# Patient Record
Sex: Male | Born: 1960 | Race: White | Hispanic: No | Marital: Married | Smoking: Never smoker
Health system: Southern US, Community
[De-identification: ages and names within clinical notes are randomized; demographics above are authoritative.]

## PROBLEM LIST (undated history)

## (undated) DIAGNOSIS — E785 Hyperlipidemia, unspecified: Secondary | ICD-10-CM

## (undated) DIAGNOSIS — I10 Essential (primary) hypertension: Secondary | ICD-10-CM

## (undated) HISTORY — PX: HERNIA REPAIR: SHX51

## (undated) HISTORY — DX: Hyperlipidemia, unspecified: E78.5

## (undated) HISTORY — DX: Essential (primary) hypertension: I10

## (undated) HISTORY — PX: VASECTOMY: SHX75

---

## 2004-12-23 ENCOUNTER — Emergency Department: Payer: Self-pay | Admitting: Unknown Physician Specialty

## 2005-06-15 ENCOUNTER — Ambulatory Visit: Payer: Self-pay | Admitting: Family Medicine

## 2006-12-12 ENCOUNTER — Ambulatory Visit: Payer: Self-pay | Admitting: Family Medicine

## 2006-12-25 ENCOUNTER — Ambulatory Visit: Payer: Self-pay | Admitting: Surgery

## 2007-01-15 ENCOUNTER — Ambulatory Visit: Payer: Self-pay | Admitting: Unknown Physician Specialty

## 2007-02-05 ENCOUNTER — Ambulatory Visit: Payer: Self-pay | Admitting: Unknown Physician Specialty

## 2011-04-19 ENCOUNTER — Ambulatory Visit: Payer: Self-pay | Admitting: Unknown Physician Specialty

## 2011-06-16 ENCOUNTER — Emergency Department: Payer: Self-pay | Admitting: Emergency Medicine

## 2011-08-07 ENCOUNTER — Ambulatory Visit: Payer: Self-pay | Admitting: Internal Medicine

## 2013-01-24 ENCOUNTER — Ambulatory Visit: Payer: Self-pay | Admitting: Unknown Physician Specialty

## 2016-12-18 NOTE — Progress Notes (Signed)
12/19/2016 9:41 AM   Bryan Olson August 27, 1961 [id number]  Referring provider: Patrice Paradise, MD [address] [facility] [address] [facility] [place], [place] [address]  Chief Complaint  Patient presents with  . New Patient (Initial Visit)    Testicular Hypofunction referred by Merlinda Frederick    HPI: Patient is a 56 year old Caucasian with symptoms of hypogonadism who presents today for further evaluation by Markham Jordan, PA.    Hypogonadism Patient is experiencing a decrease in libido, a lack of energy, a decrease in strength, a loss in height, a decreased enjoyment in life,  sadness and/or grumpiness, erections being less strong, falling asleep after dinner and a recent deterioration in their work performance.   This is indicated by his responses to the ADAM questionnaire.  He is still having spontaneous erections at night.   He does not have sleep apnea.   He states he was seen by urology in the past for possible low testosterone and was told to lose weight.       Androgen Deficiency in the Aging Male    Row Name 12/19/16 0900         Androgen Deficiency in the Aging Male   Do you have a decrease in libido (sex drive) Yes     Do you have lack of energy Yes     Do you have a decrease in strength and/or endurance Yes     Have you lost height No     Have you noticed a decreased "enjoyment of life" Yes     Are you sad and/or grumpy Yes     Are your erections less strong Yes     Have you noticed a recent deterioration in your ability to play sports No     Are you falling asleep after dinner Yes     Has there been a recent deterioration in your work performance No       Erectile dysfunction His SHIM score is 14, which is mild to moderate ED.   He has been having difficulty with erections for ten years.   His major complaint is maintaining erections.  His libido is diminished.   His risk factors for ED are age, BPH, HTN, HLD and blood pressure medications.   He denies  any painful erections or curvatures with his erections.   He has tried PDE5i in the past and they worked but recently they have been ineffective.       SHIM    Row Name 12/19/16 0915         SHIM: Over the last 6 months:   How do you rate your confidence that you could get and keep an erection? Low     When you had erections with sexual stimulation, how often were your erections hard enough for penetration (entering your partner)? Sometimes (about half the time)     During sexual intercourse, how often were you able to maintain your erection after you had penetrated (entered) your partner? Difficult     During sexual intercourse, how difficult was it to maintain your erection to completion of intercourse? Difficult     When you attempted sexual intercourse, how often was it satisfactory for you? Difficult       SHIM Total Score   SHIM 14        Score: 1-7 Severe ED 8-11 Moderate ED 12-16 Mild-Moderate ED 17-21 Mild ED 22-25 No ED   BPH WITH LUTS His IPSS score today is 2,  which is mild lower urinary tract symptomatology.  He is mostly satisfied with his quality life due to his urinary symptoms.  His major complaint today is nocturia x 1.   He has had these symptoms for several years.  He denies any dysuria, hematuria or suprapubic pain.   He also denies any recent fevers, chills, nausea or vomiting.  He does not have a family history of PCa.     IPSS    Row Name 12/19/16 0900         International Prostate Symptom Score   How often have you had the sensation of not emptying your bladder? Not at All     How often have you had to urinate less than every two hours? Not at All     How often have you found you stopped and started again several times when you urinated? Not at All     How often have you found it difficult to postpone urination? Less than 1 in 5 times     How often have you had a weak urinary stream? Not at All     How often have you had to strain to start  urination? Not at All     How many times did you typically get up at night to urinate? 1 Time     Total IPSS Score 2       Quality of Life due to urinary symptoms   If you were to spend the rest of your life with your urinary condition just the way it is now how would you feel about that? Mostly Satisfied        Score:  1-7 Mild 8-19 Moderate 20-35 Severe    PMH: Past Medical History:  Diagnosis Date  . HLD (hyperlipidemia)   . HTN (hypertension)     Surgical History: Past Surgical History:  Procedure Laterality Date  . HERNIA REPAIR    . VASECTOMY      Home Medications:  Allergies as of 12/19/2016   No Known Allergies     Medication List       Accurate as of 12/19/16  9:41 AM. Always use your most recent med list.          aspirin EC 81 MG tablet Take by mouth.   atorvastatin 10 MG tablet Commonly known as:  LIPITOR Take by mouth.   bisoprolol-hydrochlorothiazide 10-6.25 MG tablet Commonly known as:  ZIAC Take by mouth.   losartan 100 MG tablet Commonly known as:  COZAAR Take by mouth.       Allergies: No Known Allergies  Family History: Family History  Problem Relation Age of Onset  . Prostate cancer Neg Hx   . Kidney cancer Neg Hx   . Bladder Cancer Neg Hx     Social History:  reports that he has never smoked. He has never used smokeless tobacco. He reports that he does not drink alcohol or use drugs.  ROS: UROLOGY Frequent Urination?: No Hard to postpone urination?: No Burning/pain with urination?: No Get up at night to urinate?: No Leakage of urine?: No Urine stream starts and stops?: No Trouble starting stream?: No Do you have to strain to urinate?: No Blood in urine?: No Urinary tract infection?: No Sexually transmitted disease?: No Injury to kidneys or bladder?: No Painful intercourse?: No Weak stream?: No Erection problems?: Yes Penile pain?: No  Gastrointestinal Nausea?: No Vomiting?: No Indigestion/heartburn?:  Yes Diarrhea?: No Constipation?: No  Constitutional Fever: No Night sweats?: No Weight  loss?: No Fatigue?: Yes  Skin Skin rash/lesions?: No Itching?: No  Eyes Blurred vision?: No Double vision?: No  Ears/Nose/Throat Sore throat?: No Sinus problems?: No  Hematologic/Lymphatic Swollen glands?: No Easy bruising?: No  Cardiovascular Leg swelling?: No Chest pain?: No  Respiratory Cough?: No Shortness of breath?: No  Endocrine Excessive thirst?: No  Musculoskeletal Back pain?: No Joint pain?: No  Neurological Headaches?: No Dizziness?: No  Psychologic Depression?: No Anxiety?: No  Physical Exam: BP 133/81   Pulse 71   Ht 5\' 7"  (1.702 m)   Wt 288 lb 3.2 oz (130.7 kg)   BMI 45.14 kg/m   Constitutional: Well nourished. Alert and oriented, No acute distress. HEENT: [place] AT, moist mucus membranes. Trachea midline, no masses. Cardiovascular: No clubbing, cyanosis, or edema. Respiratory: Normal respiratory effort, no increased work of breathing. GI: Abdomen is soft, non tender, non distended, no abdominal masses. Liver and spleen not palpable.  No hernias appreciated.  Stool sample for occult testing is not indicated.   GU: No CVA tenderness.  No bladder fullness or masses.  Patient with circumcised phallus. Buried penis.  Urethral meatus is patent.  No penile discharge. No penile lesions or rashes. Scrotum without lesions, cysts, rashes and/or edema.  Testicles are located scrotally bilaterally. No masses are appreciated in the testicles. Left and right epididymis are normal. Rectal: Patient with  normal sphincter tone. Anus and perineum without scarring or rashes. No rectal masses are appreciated. Prostate is approximately 45 grams, no nodules are appreciated. Seminal vesicles are normal. Skin: No rashes, bruises or suspicious lesions. Lymph: No cervical or inguinal adenopathy. Neurologic: Grossly intact, no focal deficits, moving all 4 extremities. Psychiatric:  Normal mood and affect.  Laboratory Data: PSA History  0.7 ng/mL on 11/29/2016   Assessment & Plan:    1. Hypogonadism  - I explained to patient that the current recommendations from the Endocrine Society reports the diagnosis of hypogonadism requires a serum total testosterone level obtained between 8 and 10 AM at least 2 days apart that is below the laboratory parameters  for normal testosterone.     - At this time, the patient does not meet this requirement.  He will return for two morning serum testosterones, two days apart before 10 AM   -I discussed with the patient the side effects of testosterone therapy, such as: enlargement of the prostate gland that may in turn cause LUTS, possible increased risk of PCa, DVT's and/or PE's, possible increased risk of heart attack or stroke, lower sperm count, swelling of the ankles, feet, or body, with or without heart failure, enlarged or painful breasts, have problems breathing while you sleep (sleep apnea), increased prostate specific antigen, mood swings, hypertension and increased red blood cell count.  - I also discussed that some men have had success using clomid for hypogonadism.  It does seem to be more successful in younger men, but there are incidences of good results in middle-aged men.  I explained that it is used in male infertility to stimulate the testicles to make more testosterone/sperm.  There has been no long term data on side effects, but some urologists has been having success with this medication.   2. Erectile dysfunction  - SHIM score is 14  - I explained to the patient that in order to achieve an erection it takes good functioning of the nervous system (parasympathetic, sympathetic, sensory and motor), good blood flow into the erectile tissue of the penis and a desire to have sex  -  I explained that conditions like diabetes, hypertension, coronary artery disease, peripheral vascular disease, smoking, alcohol consumption, age,  sleep apnea and BPH can diminish the ability to have an erection  - RTC in 6 months for repeat SHIM score and exam   BPH with LUTS  - IPSS score is 6/2  - Continue conservative management, avoiding bladder irritants and timed voiding's  - RTC in 6 months for IPSS, PSA and exam    Return for RTC on Friday morning before 10 AM for testosterone only.  These notes generated with voice recognition software. I apologize for typographical errors.  Michiel Cowboy, PA-C  [facility] [address] [place], [place] [address] [phone]

## 2016-12-19 ENCOUNTER — Ambulatory Visit: Payer: Managed Care, Other (non HMO) | Admitting: Urology

## 2016-12-19 ENCOUNTER — Encounter: Payer: Self-pay | Admitting: Urology

## 2016-12-19 VITALS — BP 133/81 | HR 71 | Ht 67.0 in | Wt 288.2 lb

## 2016-12-19 DIAGNOSIS — N529 Male erectile dysfunction, unspecified: Secondary | ICD-10-CM

## 2016-12-19 DIAGNOSIS — E291 Testicular hypofunction: Secondary | ICD-10-CM | POA: Diagnosis not present

## 2016-12-19 DIAGNOSIS — N401 Enlarged prostate with lower urinary tract symptoms: Secondary | ICD-10-CM | POA: Diagnosis not present

## 2016-12-19 DIAGNOSIS — N138 Other obstructive and reflux uropathy: Secondary | ICD-10-CM

## 2016-12-20 ENCOUNTER — Telehealth: Payer: Self-pay

## 2016-12-20 DIAGNOSIS — E291 Testicular hypofunction: Secondary | ICD-10-CM

## 2016-12-20 LAB — TESTOSTERONE: Testosterone: 188 ng/dL — ABNORMAL LOW (ref 264–916)

## 2016-12-20 NOTE — Telephone Encounter (Signed)
-----   Message from Harle BattiestShannon A McGowan, PA-C sent at 12/20/2016  8:03 AM EST ----- Please notify the patient that his first morning testosterone is low.  We will need the second testosterone to confirm.

## 2016-12-20 NOTE — Telephone Encounter (Signed)
Spoke with pt in reference to lab results. Pt stated he has another lab appt on Friday.

## 2016-12-22 ENCOUNTER — Other Ambulatory Visit: Payer: Managed Care, Other (non HMO)

## 2016-12-22 DIAGNOSIS — E291 Testicular hypofunction: Secondary | ICD-10-CM

## 2016-12-23 LAB — TESTOSTERONE: Testosterone: 195 ng/dL — ABNORMAL LOW (ref 264–916)

## 2016-12-25 ENCOUNTER — Telehealth: Payer: Self-pay

## 2016-12-25 NOTE — Telephone Encounter (Signed)
Labs were added.

## 2016-12-25 NOTE — Telephone Encounter (Signed)
-----   Message from Harle BattiestShannon A McGowan, PA-C sent at 12/24/2016 10:28 AM EST ----- Please notify the patient that his second testosterone has returned below the laboratory parameters for testosterone. He does meet the criteria for hypogonadism at this time. Please add an LH, FSH, prolactin and LFTs to his blood work.

## 2016-12-26 ENCOUNTER — Other Ambulatory Visit: Payer: Self-pay | Admitting: Urology

## 2016-12-26 LAB — FSH/LH
FSH: 4.5 m[IU]/mL (ref 1.5–12.4)
LH: 2.7 m[IU]/mL (ref 1.7–8.6)

## 2016-12-26 LAB — HEPATIC FUNCTION PANEL
ALBUMIN: 4.1 g/dL (ref 3.5–5.5)
ALK PHOS: 100 IU/L (ref 39–117)
ALT: 61 IU/L — ABNORMAL HIGH (ref 0–44)
AST: 33 IU/L (ref 0–40)
BILIRUBIN, DIRECT: 0.08 mg/dL (ref 0.00–0.40)
Bilirubin Total: 0.3 mg/dL (ref 0.0–1.2)
TOTAL PROTEIN: 6.4 g/dL (ref 6.0–8.5)

## 2016-12-26 LAB — PROLACTIN: PROLACTIN: 19.8 ng/mL — AB (ref 4.0–15.2)

## 2016-12-26 LAB — SPECIMEN STATUS REPORT

## 2016-12-27 ENCOUNTER — Telehealth: Payer: Self-pay

## 2016-12-27 DIAGNOSIS — E229 Hyperfunction of pituitary gland, unspecified: Principal | ICD-10-CM

## 2016-12-27 DIAGNOSIS — R7989 Other specified abnormal findings of blood chemistry: Secondary | ICD-10-CM

## 2016-12-27 NOTE — Telephone Encounter (Signed)
Spoke with pt in reference to elevated prolactin and needing and MRI of pituitary gland. Pt voiced understanding. Orders were placed.

## 2016-12-27 NOTE — Telephone Encounter (Signed)
-----   Message from Harle BattiestShannon A McGowan, PA-C sent at 12/26/2016  7:48 AM EST ----- Please notify the patient that his prolactin level is elevated.  We will need to schedule an MRI of his pituitary gland to make sure there are no abnormalities within the gland.

## 2017-01-11 ENCOUNTER — Ambulatory Visit
Admission: RE | Admit: 2017-01-11 | Discharge: 2017-01-11 | Disposition: A | Discharge status: Home / Self Care | Payer: Managed Care, Other (non HMO) | Source: Ambulatory Visit | Attending: Urology | Admitting: Urology

## 2017-01-11 DIAGNOSIS — E229 Hyperfunction of pituitary gland, unspecified: Secondary | ICD-10-CM | POA: Diagnosis not present

## 2017-01-11 DIAGNOSIS — R7989 Other specified abnormal findings of blood chemistry: Secondary | ICD-10-CM

## 2017-01-11 DIAGNOSIS — R9082 White matter disease, unspecified: Secondary | ICD-10-CM | POA: Diagnosis not present

## 2017-01-11 MED ORDER — GADOBENATE DIMEGLUMINE 529 MG/ML IV SOLN
10.0000 mL | Freq: Once | INTRAVENOUS | Status: AC | PRN
Start: 2017-01-11 — End: 2017-01-11
  Administered 2017-01-11: 10 mL via INTRAVENOUS

## 2017-01-12 ENCOUNTER — Telehealth: Payer: Self-pay

## 2017-01-12 NOTE — Telephone Encounter (Signed)
LMOM

## 2017-01-12 NOTE — Telephone Encounter (Signed)
Spoke with pt in reference to MRI results and needing to see PCP. Pt voiced understanding.

## 2017-01-12 NOTE — Telephone Encounter (Signed)
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  This can be seen with uncontrolled high blood pressure, strokes, diabetes, etc.  He needs to see his PCP regarding this finding.

## 2020-06-24 ENCOUNTER — Inpatient Hospital Stay
Admission: EM | Admit: 2020-06-24 | Discharge: 2020-06-29 | DRG: 872 | Disposition: A | Discharge status: Home / Self Care | Payer: BC Managed Care – PPO | Attending: Internal Medicine | Admitting: Internal Medicine

## 2020-06-24 ENCOUNTER — Encounter: Payer: Self-pay | Admitting: Emergency Medicine

## 2020-06-24 ENCOUNTER — Emergency Department: Payer: BC Managed Care – PPO

## 2020-06-24 ENCOUNTER — Other Ambulatory Visit: Payer: Self-pay

## 2020-06-24 DIAGNOSIS — E785 Hyperlipidemia, unspecified: Secondary | ICD-10-CM | POA: Diagnosis present

## 2020-06-24 DIAGNOSIS — Z20822 Contact with and (suspected) exposure to covid-19: Secondary | ICD-10-CM | POA: Diagnosis present

## 2020-06-24 DIAGNOSIS — Z6841 Body Mass Index (BMI) 40.0 and over, adult: Secondary | ICD-10-CM

## 2020-06-24 DIAGNOSIS — E876 Hypokalemia: Secondary | ICD-10-CM

## 2020-06-24 DIAGNOSIS — R739 Hyperglycemia, unspecified: Secondary | ICD-10-CM | POA: Diagnosis present

## 2020-06-24 DIAGNOSIS — L03116 Cellulitis of left lower limb: Secondary | ICD-10-CM | POA: Diagnosis present

## 2020-06-24 DIAGNOSIS — D696 Thrombocytopenia, unspecified: Secondary | ICD-10-CM | POA: Diagnosis present

## 2020-06-24 DIAGNOSIS — L0390 Cellulitis, unspecified: Secondary | ICD-10-CM | POA: Diagnosis not present

## 2020-06-24 DIAGNOSIS — E872 Acidosis, unspecified: Secondary | ICD-10-CM

## 2020-06-24 DIAGNOSIS — R05 Cough: Secondary | ICD-10-CM | POA: Diagnosis not present

## 2020-06-24 DIAGNOSIS — A419 Sepsis, unspecified organism: Principal | ICD-10-CM

## 2020-06-24 DIAGNOSIS — N179 Acute kidney failure, unspecified: Secondary | ICD-10-CM | POA: Diagnosis present

## 2020-06-24 DIAGNOSIS — M7989 Other specified soft tissue disorders: Secondary | ICD-10-CM | POA: Diagnosis present

## 2020-06-24 DIAGNOSIS — Z79899 Other long term (current) drug therapy: Secondary | ICD-10-CM | POA: Diagnosis not present

## 2020-06-24 DIAGNOSIS — I10 Essential (primary) hypertension: Secondary | ICD-10-CM | POA: Diagnosis present

## 2020-06-24 DIAGNOSIS — R0682 Tachypnea, not elsewhere classified: Secondary | ICD-10-CM | POA: Diagnosis present

## 2020-06-24 DIAGNOSIS — R000 Tachycardia, unspecified: Secondary | ICD-10-CM | POA: Diagnosis present

## 2020-06-24 DIAGNOSIS — Z7982 Long term (current) use of aspirin: Secondary | ICD-10-CM | POA: Diagnosis not present

## 2020-06-24 DIAGNOSIS — R6520 Severe sepsis without septic shock: Secondary | ICD-10-CM | POA: Diagnosis present

## 2020-06-24 LAB — BASIC METABOLIC PANEL
Anion gap: 15 (ref 5–15)
BUN: 24 mg/dL — ABNORMAL HIGH (ref 6–20)
CO2: 25 mmol/L (ref 22–32)
Calcium: 8.7 mg/dL — ABNORMAL LOW (ref 8.9–10.3)
Chloride: 100 mmol/L (ref 98–111)
Creatinine, Ser: 1.85 mg/dL — ABNORMAL HIGH (ref 0.61–1.24)
GFR calc Af Amer: 46 mL/min — ABNORMAL LOW (ref >60)
GFR calc non Af Amer: 39 mL/min — ABNORMAL LOW (ref >60)
Glucose, Bld: 111 mg/dL — ABNORMAL HIGH (ref 70–99)
Potassium: 3.6 mmol/L (ref 3.5–5.1)
Sodium: 140 mmol/L (ref 135–145)

## 2020-06-24 LAB — CBC WITH DIFFERENTIAL/PLATELET
Abs Immature Granulocytes: 0.09 10*3/uL — ABNORMAL HIGH (ref 0.00–0.07)
Basophils Absolute: 0.1 10*3/uL (ref 0.0–0.1)
Basophils Relative: 0 %
Eosinophils Absolute: 0 10*3/uL (ref 0.0–0.5)
Eosinophils Relative: 0 %
HCT: 45.2 % (ref 39.0–52.0)
Hemoglobin: 15.8 g/dL (ref 13.0–17.0)
Immature Granulocytes: 1 %
Lymphocytes Relative: 5 %
Lymphs Abs: 0.8 10*3/uL (ref 0.7–4.0)
MCH: 30 pg (ref 26.0–34.0)
MCHC: 35 g/dL (ref 30.0–36.0)
MCV: 85.9 fL (ref 80.0–100.0)
Monocytes Absolute: 1 10*3/uL (ref 0.1–1.0)
Monocytes Relative: 6 %
Neutro Abs: 15 10*3/uL — ABNORMAL HIGH (ref 1.7–7.7)
Neutrophils Relative %: 88 %
Platelets: 166 10*3/uL (ref 150–400)
RBC: 5.26 MIL/uL (ref 4.22–5.81)
RDW: 14.1 % (ref 11.5–15.5)
WBC: 17 10*3/uL — ABNORMAL HIGH (ref 4.0–10.5)
nRBC: 0 % (ref 0.0–0.2)

## 2020-06-24 LAB — URINALYSIS, COMPLETE (UACMP) WITH MICROSCOPIC
Bacteria, UA: NONE SEEN
Bilirubin Urine: NEGATIVE
Glucose, UA: NEGATIVE mg/dL
Hgb urine dipstick: NEGATIVE
Ketones, ur: 5 mg/dL — AB
Leukocytes,Ua: NEGATIVE
Nitrite: NEGATIVE
Protein, ur: NEGATIVE mg/dL
Specific Gravity, Urine: 1.011 (ref 1.005–1.030)
pH: 5 (ref 5.0–8.0)

## 2020-06-24 LAB — LACTIC ACID, PLASMA
Lactic Acid, Venous: 1.3 mmol/L (ref 0.5–1.9)
Lactic Acid, Venous: 2 mmol/L (ref 0.5–1.9)
Lactic Acid, Venous: 1.3 mmol/L (ref 0.5–1.9)

## 2020-06-24 LAB — COMPREHENSIVE METABOLIC PANEL
ALT: 57 U/L — ABNORMAL HIGH (ref 0–44)
AST: 34 U/L (ref 15–41)
Albumin: 3.9 g/dL (ref 3.5–5.0)
Alkaline Phosphatase: 63 U/L (ref 38–126)
Anion gap: 13 (ref 5–15)
BUN: 19 mg/dL (ref 6–20)
CO2: 25 mmol/L (ref 22–32)
Calcium: 8.7 mg/dL — ABNORMAL LOW (ref 8.9–10.3)
Chloride: 101 mmol/L (ref 98–111)
Creatinine, Ser: 1.52 mg/dL — ABNORMAL HIGH (ref 0.61–1.24)
GFR calc Af Amer: 58 mL/min — ABNORMAL LOW (ref >60)
GFR calc non Af Amer: 50 mL/min — ABNORMAL LOW (ref >60)
Glucose, Bld: 152 mg/dL — ABNORMAL HIGH (ref 70–99)
Potassium: 3.2 mmol/L — ABNORMAL LOW (ref 3.5–5.1)
Sodium: 139 mmol/L (ref 135–145)
Total Bilirubin: 2.6 mg/dL — ABNORMAL HIGH (ref 0.3–1.2)
Total Protein: 7.2 g/dL (ref 6.5–8.1)

## 2020-06-24 LAB — GLUCOSE, CAPILLARY: Glucose-Capillary: 92 mg/dL (ref 70–99)

## 2020-06-24 LAB — MAGNESIUM: Magnesium: 2.1 mg/dL (ref 1.7–2.4)

## 2020-06-24 LAB — SARS CORONAVIRUS 2 BY RT PCR (HOSPITAL ORDER, PERFORMED IN ~~LOC~~ HOSPITAL LAB): SARS Coronavirus 2: NEGATIVE

## 2020-06-24 MED ORDER — SODIUM CHLORIDE 0.9 % IV SOLN
1.0000 g | INTRAVENOUS | Status: DC
  Administered 2020-06-24 – 2020-06-28 (×5): 1 g via INTRAVENOUS
  Filled 2020-06-24: qty 10
  Filled 2020-06-24: qty 1
  Filled 2020-06-24: qty 10
  Filled 2020-06-24 (×3): qty 1

## 2020-06-24 MED ORDER — LACTATED RINGERS IV SOLN: INTRAVENOUS | Status: DC

## 2020-06-24 MED ORDER — ACETAMINOPHEN 325 MG PO TABS
650.0000 mg | ORAL_TABLET | Freq: Four times a day (QID) | ORAL | Status: DC | PRN
  Administered 2020-06-24 – 2020-06-28 (×3): 650 mg via ORAL
  Filled 2020-06-24 (×9): qty 2

## 2020-06-24 MED ORDER — VANCOMYCIN HCL IN DEXTROSE 1-5 GM/200ML-% IV SOLN
1000.0000 mg | Freq: Once | INTRAVENOUS | Status: AC
  Administered 2020-06-24: 1000 mg via INTRAVENOUS
  Filled 2020-06-24: qty 200

## 2020-06-24 MED ORDER — CEFTRIAXONE SODIUM 1 G IJ SOLR
1.0000 g | Freq: Once | INTRAMUSCULAR | Status: AC
  Administered 2020-06-24: 1 g via INTRAMUSCULAR
  Filled 2020-06-24: qty 10

## 2020-06-24 MED ORDER — VANCOMYCIN HCL 1500 MG/300ML IV SOLN
1500.0000 mg | Freq: Once | INTRAVENOUS | Status: AC
  Administered 2020-06-24: 1500 mg via INTRAVENOUS
  Filled 2020-06-24: qty 300

## 2020-06-24 MED ORDER — SODIUM CHLORIDE 0.9 % IV BOLUS (SEPSIS)
1000.0000 mL | Freq: Once | INTRAVENOUS | Status: AC
  Administered 2020-06-24: 1000 mL via INTRAVENOUS

## 2020-06-24 MED ORDER — SODIUM CHLORIDE 0.9 % IV BOLUS
1000.0000 mL | Freq: Once | INTRAVENOUS | Status: AC
  Administered 2020-06-24: 1000 mL via INTRAVENOUS

## 2020-06-24 MED ORDER — LIDOCAINE HCL (PF) 1 % IJ SOLN
5.0000 mL | Freq: Once | INTRAMUSCULAR | Status: AC
  Administered 2020-06-24: 5 mL via INTRADERMAL
  Filled 2020-06-24: qty 5

## 2020-06-24 MED ORDER — VANCOMYCIN HCL 1500 MG/300ML IV SOLN
1500.0000 mg | INTRAVENOUS | Status: DC
  Filled 2020-06-24: qty 300

## 2020-06-24 NOTE — Progress Notes (Signed)
CODE SEPSIS - PHARMACY COMMUNICATION  **Broad Spectrum Antibiotics should be administered within 1 hour of Sepsis diagnosis**  Time Code Sepsis Called/Page Received: 1943  Antibiotics Ordered: Vancomycin/ rocephin   Time of 1st antibiotic administration: 1930   Katha Cabal ,PharmD Clinical Pharmacist  06/24/2020  7:50 PM

## 2020-06-24 NOTE — Progress Notes (Signed)
Pharmacy Antibiotic Note  Bryan Olson is a 59 y.o. male admitted on 06/24/2020 with cellulitis.  Pharmacy has been consulted for Vancomycin dosing.  Plan: Vancomycin 2500mg  IV loading dose followed by 1500mg  IV q24h. Will follow renal function.  Height: 5\' 7"  (170.2 cm) Weight: 130.7 kg (288 lb 2.3 oz) IBW/kg (Calculated) : 66.1  Temp (24hrs), Avg:98.8 F (37.1 C), Min:98.3 F (36.8 C), Max:99.1 F (37.3 C)  Recent Labs  Lab 06/24/20 0905 06/24/20 1855  WBC 17.0*  --   CREATININE 1.52* 1.85*  LATICACIDVEN 1.3 2.0*    Estimated Creatinine Clearance: 56.6 mL/min (A) (by C-G formula based on SCr of 1.85 mg/dL (H)).    No Known Allergies  Antimicrobials this admission: Vancomycin 8/19 >>   Dose adjustments this admission:  Microbiology results:   Thank you for allowing pharmacy to be a part of this patient's care.  06/26/20, PharmD, BCPS 06/24/2020 9:05 PM

## 2020-06-24 NOTE — ED Triage Notes (Signed)
Scratched left lower leg on a crab pot one week ago.  STates since Tuesday, lower leg reddened.  Also c/o fevers intermittently.

## 2020-06-24 NOTE — ED Notes (Signed)
See triage note  Presents with redness and swelling to left lower leg  States he scratched his leg about 1 week ago

## 2020-06-24 NOTE — ED Provider Notes (Signed)
[facility] Emergency Department Provider Note  ____________________________________________   First MD Initiated Contact with Patient 06/24/20 1653     (approximate)  I have reviewed the triage vital signs and the nursing notes.   HISTORY  Chief Complaint Leg Swelling    HPI Bryan Olson is a 59 y.o. male presents emergency department with redness and swelling to the left lower leg.  Patient states he started having chills and sweats last night.  Some nausea but no vomiting.  States he hit his leg on a old crab pot last week and then jumped in the pool thinking it would clean the wound out.  Now his left lower leg has gotten very red and tender with some pain radiating up into the left thigh.  No chest pain or shortness of breath.  No urinary type symptoms.  Tetanus is up-to-date.  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  Has a history of elevated liver enzymes    Past Medical History:  Diagnosis Date  . HLD (hyperlipidemia)   . HTN (hypertension)     Patient Active Problem List   Diagnosis Date Noted  . Cellulitis 06/24/2020  . HLD (hyperlipidemia)   . HTN (hypertension)   . Sepsis (HCC)   . AKI (acute kidney injury) (HCC)   . Lactic acidosis     Past Surgical History:  Procedure Laterality Date  . HERNIA REPAIR    . VASECTOMY      Prior to Admission medications   Medication Sig Start Date End Date Taking? Authorizing Provider  aspirin EC 81 MG tablet Take by mouth.    [provider]  atorvastatin (LIPITOR) 10 MG tablet Take by mouth. 11/29/16   [provider]  bisoprolol-hydrochlorothiazide [facility]) 10-6.25 MG tablet Take by mouth. 11/29/16   [provider]  losartan (COZAAR) 100 MG tablet Take by mouth. 11/29/16   [provider]    Allergies Patient has no known allergies.  Family History  Problem Relation Age of Onset  . Prostate cancer Neg Hx   . Kidney cancer Neg Hx    . Bladder Cancer Neg Hx     Social History Social History   Tobacco Use  . Smoking status: Never Smoker  . Smokeless tobacco: Never Used  Substance Use Topics  . Alcohol use: No  . Drug use: No    Review of Systems  Constitutional: Positive fever/chills Eyes: No visual changes. ENT: No sore throat. Respiratory: Denies cough Cardiovascular: Denies chest pain Gastrointestinal: Denies abdominal pain Genitourinary: Negative for dysuria. Musculoskeletal: Negative for back pain. Skin: Negative for rash.  Positive for redness to the lower leg Psychiatric: no mood changes,     ____________________________________________   PHYSICAL EXAM:  VITAL SIGNS: ED Triage Vitals [06/24/20 0902]  Enc Vitals Group     BP 120/73     Pulse Rate 74     Resp 16     Temp 99.1 F (37.3 C)     Temp Source Oral     SpO2 95 %     Weight 288 lb 2.3 oz (130.7 kg)     Height 5\' 7"  (1.702 m)     Head Circumference      Peak Flow      Pain Score 6     Pain Loc      Pain Edu?      Excl. in GC?     Constitutional: Alert and oriented. Well  appearing and in no acute distress. Eyes: Conjunctivae are normal.  Head: Atraumatic. Nose: No congestion/rhinnorhea. Mouth/Throat: Mucous membranes are moist.   Neck:  supple no lymphadenopathy noted Cardiovascular: Normal rate, regular rhythm. Heart sounds are normal Respiratory: Normal respiratory effort.  No retractions, lungs c t a  Abd: soft nontender bs normal all 4 quad GU: deferred Musculoskeletal: FROM all extremities, warm and well perfused, left lower leg has circumferential redness noted and old abrasion noted on the leg, streaks heading up to the thigh, neurovascular is intact Neurologic:  Normal speech and language.  Skin:  Skin is warm, dry and intact.  Redness like cellulitis noted to the left lower leg Psychiatric: Mood and affect are normal. Speech and behavior are normal.  ____________________________________________    LABS (all labs ordered are listed, but only abnormal results are displayed)  Labs Reviewed  COMPREHENSIVE METABOLIC PANEL - Abnormal; Notable for the following components:      Result Value   Potassium 3.2 (*)    Glucose, Bld 152 (*)    Creatinine, Ser 1.52 (*)    Calcium 8.7 (*)    ALT 57 (*)    Total Bilirubin 2.6 (*)    GFR calc non Af Amer 50 (*)    GFR calc Af Amer 58 (*)    All other components within normal limits  CBC WITH DIFFERENTIAL/PLATELET - Abnormal; Notable for the following components:   WBC 17.0 (*)    Neutro Abs 15.0 (*)    Abs Immature Granulocytes 0.09 (*)    All other components within normal limits  URINALYSIS, COMPLETE (UACMP) WITH MICROSCOPIC - Abnormal; Notable for the following components:   Color, Urine YELLOW (*)    APPearance HAZY (*)    Ketones, ur 5 (*)    All other components within normal limits  BASIC METABOLIC PANEL - Abnormal; Notable for the following components:   Glucose, Bld 111 (*)    BUN 24 (*)    Creatinine, Ser 1.85 (*)    Calcium 8.7 (*)    GFR calc non Af Amer 39 (*)    GFR calc Af Amer 46 (*)    All other components within normal limits  LACTIC ACID, PLASMA - Abnormal; Notable for the following components:   Lactic Acid, Venous 2.0 (*)    All other components within normal limits  SARS CORONAVIRUS 2 BY RT PCR (HOSPITAL ORDER, PERFORMED IN [facility])  CULTURE, BLOOD (SINGLE)  LACTIC ACID, PLASMA  GLUCOSE, CAPILLARY  MAGNESIUM  LACTIC ACID, PLASMA  CBG MONITORING, ED   ____________________________________________   ____________________________________________  RADIOLOGY  Ultrasound of the left lower extremity does not show a DVT  ____________________________________________   PROCEDURES  Procedure(s) performed: Rocephin 1 g IM   Procedures    ____________________________________________   INITIAL IMPRESSION / ASSESSMENT AND PLAN / ED COURSE  Pertinent labs & imaging results that were  available during my care of the patient were reviewed by me and considered in my medical decision making (see chart for details).   The patient is a 59 year old male presents emergency department with chills and redness to the left lower leg.  See HPI.  Physical exam is consistent with cellulitis.  However the patient is tender in the left great saphenous vein area so we will do a ultrasound to rule out DVT.  DDx: Cellulitis, DVT, osteomyelitis, sepsis  CBC is elevated at 17, lactic acid is normal, comprehensive metabolic panel has decreased potassium of 3.2, elevated glucose at  152  I feel that the patient is not septic as his lactic is normal and his heart rate is normal. Due to the cellulitis did give him Rocephin 1 g IM.  He is able to tolerate p.o. fluids.  I anticipate discharging him with Keflex and Septra.  ----------------------------------------- 7:42 PM on 06/24/2020 -----------------------------------------  Patient appears to have decompensated while here in the ED.  Kidney functions have decreased and his lactic acid is now come back with an increased level of 2.0.  I do feel at this time the patient will need to be admitted.  We have already given him Rocephin 1 g IM so we will order another gram of Rocephin IV, vancomycin is currently running.  Fluids have currently been given.  Patient and wife were notified of the evolving results and his decompensation.  He agrees for admission.    Bryan Olson was evaluated in Emergency Department on 06/24/2020 for the symptoms described in the history of present illness. He was evaluated in the context of the global COVID-19 pandemic, which necessitated consideration that the patient might be at risk for infection with the SARS-CoV-2 virus that causes COVID-19. Institutional protocols and algorithms that pertain to the evaluation of patients at risk for COVID-19 are in a state of rapid change based on information released by regulatory  bodies including the CDC and federal and state organizations. These policies and algorithms were followed during the patient's care in the ED.    As part of my medical decision making, I reviewed the following data within the electronic MEDICAL RECORD NUMBER History obtained from family, Nursing notes reviewed and incorporated, Labs reviewed , Old chart reviewed, Radiograph reviewed , Discussed with admitting physician , Notes from prior ED visits and [place] Controlled Substance Database  ____________________________________________   FINAL CLINICAL IMPRESSION(S) / ED DIAGNOSES  Final diagnoses:  Cellulitis of left lower extremity  Sepsis due to cellulitis (HCC)      NEW MEDICATIONS STARTED DURING THIS VISIT:  New Prescriptions   No medications on file     Note:  This document was prepared using Dragon voice recognition software and may include unintentional dictation errors.    Faythe Ghee, PA-C 06/24/20 2106    Dionne Bucy, MD 06/24/20 2244

## 2020-06-24 NOTE — H&P (Signed)
History and Physical        Hospital Admission Note Date: 06/24/2020  Patient name: Bryan Olson Medical record number: [id number] Date of birth: [birth year] Age: 59 y.o. Gender: male  PCP: Patrice Paradise, MD    Patient coming from: Home   I have reviewed all records in the [facility].    Chief Complaint:  Leg Swelling/Redness   HPI: Bryan Olson is a 59 y.o. male with PMH of HTN and HLD whp presents for redness and edema of LLE. Woke up this morning and noticed the redness. Started having chills and sweats last night. Vomited yesterday. No emesis today but positive for nausea. He bumped his leg on a "old, nasty, dirty" crab pot last Thursday then was in the swimming pool at [facility]. Pain is very tender and extends from ankle to inner thigh. 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 Has been in ED since before 8 am this morning. Reports has had nothing to eat or drink all day.    ED work-up/course:  The patient is a 59 year old male presents emergency department with chills and redness to the left lower leg.  See HPI.  Physical exam is consistent with cellulitis.  However the patient is tender in the left great saphenous vein area so we will do a ultrasound to rule out DVT.  DDx: Cellulitis, DVT, osteomyelitis, sepsis  CBC is elevated at 17, lactic acid is normal, comprehensive metabolic panel has decreased potassium of 3.2, elevated glucose at 152  I feel that the patient is not septic as his lactic is normal and his heart rate is normal. Due to the cellulitis did give him Rocephin 1 g IM.  He is able to tolerate p.o. fluids.  I anticipate discharging him with Keflex and Septra.  ----------------------------------------- 7:42 PM on 06/24/2020 -----------------------------------------  Patient appears to have decompensated while here in the ED.  Kidney  functions have decreased and his lactic acid is now come back with an increased level of 2.0.  I do feel at this time the patient will need to be admitted.  We have already given him Rocephin 1 g IM so we will order another gram of Rocephin IV, vancomycin is currently running.  Fluids have currently been given.  Patient and wife were notified of the evolving results and his decompensation.  He agrees for admission.   Review of Systems: Positives marked in 'bold' Constitutional: Denies fever, chills, diaphoresis, poor appetite and fatigue.  HEENT: Denies photophobia, eye pain, redness, hearing loss, ear pain, congestion, sore throat, rhinorrhea, sneezing, mouth sores, trouble swallowing, neck pain, neck stiffness and tinnitus.   Respiratory: Denies SOB, DOE, cough, chest tightness,  and wheezing.   Cardiovascular: Denies chest pain, palpitations and leg swelling.  Gastrointestinal: Denies nausea, vomiting, abdominal pain, diarrhea, constipation, blood in stool and abdominal distention.  Genitourinary: Denies dysuria, urgency, frequency, hematuria, flank pain and difficulty urinating.  Musculoskeletal: Denies myalgias, back pain, joint swelling, arthralgias and gait problem.  Skin: Denies pallor, rash and wound.  Neurological: Denies dizziness, seizures, syncope, weakness, light-headedness, numbness and headaches.  Hematological: Denies adenopathy. Easy bruising,  personal or family bleeding history  Psychiatric/Behavioral: Denies suicidal ideation, mood changes, confusion, nervousness, sleep disturbance and agitation  Past Medical History: Past Medical History:  Diagnosis Date  . HLD (hyperlipidemia)   . HTN (hypertension)     Past Surgical History:  Procedure Laterality Date  . HERNIA REPAIR    . VASECTOMY      Medications: Prior to Admission medications   Medication Sig Start Date End Date Taking? Authorizing Provider  aspirin EC 81 MG tablet Take by mouth.    [provider]  atorvastatin (LIPITOR) 10 MG tablet Take by mouth. 11/29/16   [provider]  bisoprolol-hydrochlorothiazide [facility]) 10-6.25 MG tablet Take by mouth. 11/29/16   [provider]  losartan (COZAAR) 100 MG tablet Take by mouth. 11/29/16   [provider]    Allergies:  No Known Allergies  Social History:  reports that he has never smoked. He has never used smokeless tobacco. He reports that he does not drink alcohol and does not use drugs.  Family History: Family History  Problem Relation Age of Onset  . Prostate cancer Neg Hx   . Kidney cancer Neg Hx   . Bladder Cancer Neg Hx     Physical Exam: Blood pressure (!) 143/71, pulse 94, temperature 98.3 F (36.8 C), temperature source Oral, resp. rate 18, height 5\' 7"  (1.702 m), weight 130.7 kg, SpO2 96 %. General: Alert, awake, oriented x3, in no acute distress. Eyes: pink conjunctiva,anicteric sclera, pupils equal and reactive to light and accomodation, HEENT: normocephalic, atraumatic, oropharynx clear Neck: supple, no masses or lymphadenopathy, no goiter, no bruits, no JVD CVS: Regular rate and rhythm, without murmurs, rubs or gallops. Left LE edematous compared to right.  Resp : Clear to auscultation bilaterally, no wheezing, rales or rhonchi. GI : Soft, nontender, nondistended, positive bowel sounds, no masses. No hepatomegaly. No hernia.  Musculoskeletal: No clubbing or cyanosis, positive pedal pulses. No contracture. ROM intact  Neuro: Grossly intact, no focal neurological deficits, strength 5/5 upper and lower extremities bilaterally Psych: alert and oriented x 3, normal mood and affect Skin: Left LE with circumferential redness present on lower leg with streaking present up the thigh. Warm to the touch.    LABS on Admission: I have personally reviewed all the labs and imagings below    Basic Metabolic Panel: Recent Labs  Lab 06/24/20 0905 06/24/20 1855  NA 139 140  K 3.2* 3.6  CL 101 100   CO2 25 25  GLUCOSE 152* 111*  BUN 19 24*  CREATININE 1.52* 1.85*  CALCIUM 8.7* 8.7*  MG  --  2.1   Liver Function Tests: Recent Labs  Lab 06/24/20 0905  AST 34  ALT 57*  ALKPHOS 63  BILITOT 2.6*  PROT 7.2  ALBUMIN 3.9   No results for input(s): LIPASE, AMYLASE in the last 168 hours. No results for input(s): AMMONIA in the last 168 hours. CBC: Recent Labs  Lab 06/24/20 0905  WBC 17.0*  NEUTROABS 15.0*  HGB 15.8  HCT 45.2  MCV 85.9  PLT 166   Cardiac Enzymes: No results for input(s): CKTOTAL, CKMB, CKMBINDEX, TROPONINI in the last 168 hours. BNP: Invalid input(s): POCBNP CBG: Recent Labs  Lab 06/24/20 1904  GLUCAP 92    Radiological Exams on Admission:  DG Chest 2 View  Result Date: 06/24/2020 CLINICAL DATA:  Fever. EXAM: CHEST - 2 VIEW COMPARISON:  06/16/2011. FINDINGS: Mediastinum and hilar structures normal. Heart size normal. Mild left base infiltrate cannot be excluded.  No pleural effusion or pneumothorax. Degenerative change thoracic spine. IMPRESSION: Mild left base infiltrate cannot be excluded. Electronically Signed   By: Maisie Fushomas  Register   On: 06/24/2020 09:30   DG Tibia/Fibula Left  Result Date: 06/24/2020 CLINICAL DATA:  Pain infection EXAM: LEFT TIBIA AND FIBULA - 2 VIEW COMPARISON:  None. FINDINGS: No fracture or malalignment. No periostitis or bone destruction. Soft tissue edema without emphysema or radiopaque foreign body IMPRESSION: Soft tissue edema. No acute osseous abnormality. Electronically Signed   By: Jasmine PangKim  Fujinaga M.D.   On: 06/24/2020 20:14   US Venous Img Lower Unilateral Left  Result Date: 06/24/2020 CLINICAL DATA:  Left leg pain, edema, and erythema for 1 week EXAM: LEFT LOWER EXTREMITY VENOUS DOPPLER ULTRASOUND TECHNIQUE: Gray-scale sonography with compression, as well as color and duplex ultrasound, were performed to evaluate the deep venous system(s) from the level of the common femoral vein through the popliteal and proximal calf  veins. COMPARISON:  None. FINDINGS: VENOUS Normal compressibility of the common femoral, superficial femoral, and popliteal veins, as well as the visualized calf veins. Visualized portions of profunda femoral vein and great saphenous vein unremarkable. No filling defects to suggest DVT on grayscale or color Doppler imaging. Doppler waveforms show normal direction of venous flow, normal respiratory plasticity and response to augmentation. Limited views of the contralateral common femoral vein are unremarkable. OTHER Borderline enlarged lymph nodes in the left inguinal region measure up to 16 mm in short axis, likely reactive. Limitations: none IMPRESSION: 1. No evidence of deep venous thrombosis within the left lower extremity. 2. Likely reactive lymph nodes within the left inguinal region. Electronically Signed   By: Sharlet SalinaMichael  Brown M.D.   On: 06/24/2020 18:29     Assessment/Plan Active Problems:   Cellulitis   HLD (hyperlipidemia)   HTN (hypertension)   Sepsis (HCC)   AKI (acute kidney injury) (HCC)   Lactic acidosis  Cellulitis with Sepsis  Patient presenting with symptoms and exam findings consistent with cellulitis. Noted in ED to have leukocytosis to 17 and lactic acid trending from 1.3 > 2.0. Treated as code sepsis with fluids and broad spectrum antibiotic regimen initiated. Ultrasound of LLE neg for DVT. Did show reactive lymphadenopathy in the inguinal region. Xray of left tib/fib was not concerning for findings consistent with osteomyelitis.  -admit to MedSurg, vital signs per unit -continue Vancomycin and CTX, narrow as appropriate  -monitor erythema -pain control with Tylenol and Oxycodone  -S/p 2L NS, continue NS at 125 cc/hr overnight  -blood cultures pending   AKI  Cr uptrending from 1.52 > 1.85 today likely due to poor PO intake. GFR 39. No prior value for baseline.  -IVFs as above  -repeat Cr in AM  -hold nephrotoxic agents   HTN  BP is stable.  -continue Bisoprolol   -hold Losartan and HCTZ with AKI   HLD  -continue Lipitor    DVT prophylaxis: Lovenox   CODE STATUS: FULL   Consults called: None   Family Communication: Admission, patients condition and plan of care including tests being ordered have been discussed with the patient and patient's wife who indicates understanding and agree with the plan and Code Status  Admission status: Inpatient   The medical decision making on this patient was of high complexity and the patient is at high risk for clinical deterioration, therefore this is a level 3 admission.  Severity of Illness:     Moderate  The appropriate patient status for this patient is INPATIENT. Inpatient status  is judged to be reasonable and necessary in order to provide the required intensity of service to ensure the patient's safety. The patient's presenting symptoms, physical exam findings, and initial radiographic and laboratory data in the context of their chronic comorbidities is felt to place them at high risk for further clinical deterioration. Furthermore, it is not anticipated that the patient will be medically stable for discharge from the hospital within 2 midnights of admission. The following factors support the patient status of inpatient.   " The patient's presenting symptoms include redness and edema to LLE. " The worrisome physical exam findings include erythema with streaking, edema of LLE. " The initial radiographic and laboratory data are worrisome because of leukocytosis, lactic acidosis, AKI. " The chronic co-morbidities include HTN and HLD.   * I certify that at the point of admission it is my clinical judgment that the patient will require inpatient hospital care spanning beyond 2 midnights from the point of admission due to high intensity of service, high risk for further deterioration and high frequency of surveillance required.*    Time Spent on Admission: 50 minutes      De Hollingshead D.O.  [facility] 06/24/2020, 8:49 PM

## 2020-06-24 NOTE — ED Notes (Signed)
Antibiotics ordered before cultures

## 2020-06-25 ENCOUNTER — Encounter: Payer: Self-pay | Admitting: Internal Medicine

## 2020-06-25 ENCOUNTER — Inpatient Hospital Stay: Payer: BC Managed Care – PPO

## 2020-06-25 DIAGNOSIS — E876 Hypokalemia: Secondary | ICD-10-CM

## 2020-06-25 DIAGNOSIS — R6520 Severe sepsis without septic shock: Secondary | ICD-10-CM

## 2020-06-25 LAB — BASIC METABOLIC PANEL
Anion gap: 13 (ref 5–15)
BUN: 21 mg/dL — ABNORMAL HIGH (ref 6–20)
CO2: 20 mmol/L — ABNORMAL LOW (ref 22–32)
Calcium: 7.7 mg/dL — ABNORMAL LOW (ref 8.9–10.3)
Chloride: 105 mmol/L (ref 98–111)
Creatinine, Ser: 1.33 mg/dL — ABNORMAL HIGH (ref 0.61–1.24)
GFR calc Af Amer: >60 mL/min (ref >60)
GFR calc non Af Amer: 59 mL/min — ABNORMAL LOW (ref >60)
Glucose, Bld: 110 mg/dL — ABNORMAL HIGH (ref 70–99)
Potassium: 3 mmol/L — ABNORMAL LOW (ref 3.5–5.1)
Sodium: 138 mmol/L (ref 135–145)

## 2020-06-25 LAB — CREATININE, SERUM
Creatinine, Ser: 1.64 mg/dL — ABNORMAL HIGH (ref 0.61–1.24)
GFR calc Af Amer: 53 mL/min — ABNORMAL LOW (ref >60)
GFR calc non Af Amer: 45 mL/min — ABNORMAL LOW (ref >60)

## 2020-06-25 LAB — CBC
HCT: 38.9 % — ABNORMAL LOW (ref 39.0–52.0)
Hemoglobin: 13.9 g/dL (ref 13.0–17.0)
MCH: 30.5 pg (ref 26.0–34.0)
MCHC: 35.7 g/dL (ref 30.0–36.0)
MCV: 85.3 fL (ref 80.0–100.0)
Platelets: 126 10*3/uL — ABNORMAL LOW (ref 150–400)
RBC: 4.56 MIL/uL (ref 4.22–5.81)
RDW: 14 % (ref 11.5–15.5)
WBC: 10.9 10*3/uL — ABNORMAL HIGH (ref 4.0–10.5)
nRBC: 0 % (ref 0.0–0.2)

## 2020-06-25 LAB — MAGNESIUM: Magnesium: 2 mg/dL (ref 1.7–2.4)

## 2020-06-25 LAB — HIV ANTIBODY (ROUTINE TESTING W REFLEX): HIV Screen 4th Generation wRfx: NONREACTIVE

## 2020-06-25 MED ORDER — ACETAMINOPHEN 650 MG RE SUPP: 650.0000 mg | Freq: Four times a day (QID) | RECTAL | Status: DC | PRN

## 2020-06-25 MED ORDER — LOSARTAN POTASSIUM 50 MG PO TABS
100.0000 mg | ORAL_TABLET | Freq: Every day | ORAL | Status: DC
  Administered 2020-06-25 – 2020-06-29 (×5): 100 mg via ORAL
  Filled 2020-06-25 (×5): qty 2

## 2020-06-25 MED ORDER — ATORVASTATIN CALCIUM 10 MG PO TABS
10.0000 mg | ORAL_TABLET | Freq: Every day | ORAL | Status: DC
  Administered 2020-06-25: 10 mg via ORAL
  Filled 2020-06-25 (×5): qty 1

## 2020-06-25 MED ORDER — OXYCODONE HCL 5 MG PO TABS: 5.0000 mg | ORAL_TABLET | ORAL | Status: DC | PRN

## 2020-06-25 MED ORDER — ENOXAPARIN SODIUM 40 MG/0.4ML ~~LOC~~ SOLN
40.0000 mg | Freq: Two times a day (BID) | SUBCUTANEOUS | Status: DC
  Administered 2020-06-25 – 2020-06-29 (×9): 40 mg via SUBCUTANEOUS
  Filled 2020-06-25 (×10): qty 0.4

## 2020-06-25 MED ORDER — SODIUM CHLORIDE 0.9 % IV SOLN: INTRAVENOUS | Status: DC

## 2020-06-25 MED ORDER — GADOBUTROL 1 MMOL/ML IV SOLN
10.0000 mL | Freq: Once | INTRAVENOUS | Status: AC | PRN
  Administered 2020-06-25: 10 mL via INTRAVENOUS
  Filled 2020-06-25: qty 10

## 2020-06-25 MED ORDER — VANCOMYCIN HCL IN DEXTROSE 1-5 GM/200ML-% IV SOLN
1000.0000 mg | Freq: Two times a day (BID) | INTRAVENOUS | Status: DC
  Administered 2020-06-25 – 2020-06-28 (×7): 1000 mg via INTRAVENOUS
  Filled 2020-06-25 (×8): qty 200

## 2020-06-25 MED ORDER — BISOPROLOL FUMARATE 5 MG PO TABS
10.0000 mg | ORAL_TABLET | Freq: Every day | ORAL | Status: DC
  Administered 2020-06-25 – 2020-06-29 (×5): 10 mg via ORAL
  Filled 2020-06-25 (×5): qty 2

## 2020-06-25 MED ORDER — ONDANSETRON HCL 4 MG/2ML IJ SOLN: 4.0000 mg | Freq: Once | INTRAMUSCULAR | Status: AC

## 2020-06-25 MED ORDER — VANCOMYCIN HCL IN DEXTROSE 1-5 GM/200ML-% IV SOLN: 1000.0000 mg | Freq: Once | INTRAVENOUS | Status: DC

## 2020-06-25 MED ORDER — ACETAMINOPHEN 325 MG PO TABS
650.0000 mg | ORAL_TABLET | Freq: Four times a day (QID) | ORAL | Status: DC | PRN
  Administered 2020-06-25 – 2020-06-29 (×6): 650 mg via ORAL

## 2020-06-25 MED ORDER — POTASSIUM CHLORIDE CRYS ER 20 MEQ PO TBCR
40.0000 meq | EXTENDED_RELEASE_TABLET | ORAL | Status: AC
  Administered 2020-06-25 (×2): 40 meq via ORAL
  Filled 2020-06-25 (×2): qty 2

## 2020-06-25 MED ORDER — ONDANSETRON HCL 4 MG/2ML IJ SOLN
INTRAMUSCULAR | Status: AC
  Administered 2020-06-25: 4 mg via INTRAVENOUS
  Filled 2020-06-25: qty 2

## 2020-06-25 MED ORDER — ASPIRIN EC 81 MG PO TBEC
81.0000 mg | DELAYED_RELEASE_TABLET | Freq: Every day | ORAL | Status: DC
  Administered 2020-06-25 – 2020-06-29 (×5): 81 mg via ORAL
  Filled 2020-06-25 (×5): qty 1

## 2020-06-25 MED ORDER — SODIUM CHLORIDE 0.9 % IV SOLN: 1.0000 g | INTRAVENOUS | Status: DC

## 2020-06-25 NOTE — Progress Notes (Signed)
Pharmacy Antibiotic Note  Bryan Olson is a 60 y.o. male admitted on 06/24/2020 with cellulitis.  Pharmacy has been consulted for Vancomycin dosing.  Plan: Patient received vancomycin 2500mg  IV loading dose in the ED last night  Renal function improving with fluid resuscitation. Will adjust vancomycin maintenance dose to 1000 mg q12h per nomogram  Continue to monitor daily Scr while on vancomycin  Height: 5\' 7"  (170.2 cm) Weight: 130.7 kg (288 lb 2.3 oz) IBW/kg (Calculated) : 66.1  Temp (24hrs), Avg:99.6 F (37.6 C), Min:98.3 F (36.8 C), Max:101 F (38.3 C)  Recent Labs  Lab 06/24/20 0905 06/24/20 1855 06/24/20 2055 06/25/20 0504 06/25/20 0643  WBC 17.0*  --   --   --  10.9*  CREATININE 1.52* 1.85*  --  1.64* 1.33*  LATICACIDVEN 1.3 2.0* 1.3  --   --     Estimated Creatinine Clearance: 78.7 mL/min (A) (by C-G formula based on SCr of 1.33 mg/dL (H)).    No Known Allergies  Antimicrobials this admission: Vancomycin 8/19 >>  Ceftriaxone 8/19 >>  Dose adjustments this admission: 8/20: Changed vancomycin maintenance regimen from vancomycin 1500 mg q24h to vancomycin 1000 mg q12h based on improving renal function  Microbiology results: 8/19 BCx (one set): NG < 12h  Thank you for allowing pharmacy to be a part of this patient's care.  9/20 06/25/2020 8:36 AM

## 2020-06-25 NOTE — Progress Notes (Signed)
PHARMACIST - PHYSICIAN COMMUNICATION  CONCERNING:  Enoxaparin (Lovenox) for DVT Prophylaxis    RECOMMENDATION: Patient was prescribed enoxaprin 40mg  q24 hours for VTE prophylaxis.   Filed Weights   06/24/20 0902  Weight: 130.7 kg (288 lb 2.3 oz)    Body mass index is 45.13 kg/m.  Estimated Creatinine Clearance: 56.6 mL/min (A) (by C-G formula based on SCr of 1.85 mg/dL (H)).   Based on [facility] policy patient is candidate for enoxaparin 40mg  every 12 hour dosing due to BMI being >40.   DESCRIPTION: Pharmacy has adjusted enoxaparin dose per [facility] policy.  Patient is now receiving enoxaparin 40mg  every 12 hours.    [facility], PharmD Clinical Pharmacist  06/25/2020 12:38 AM

## 2020-06-25 NOTE — ED Notes (Signed)
Speaking with MRI tech   Wife remains at bedside

## 2020-06-25 NOTE — Progress Notes (Addendum)
Progress Note    Bryan Olson  ZOX:[id number] DOB: May 13, 1961  DOA: 06/24/2020 PCP: Patrice ParadiseMcLaughlin, Miriam K, MD      Brief Narrative:    Medical records reviewed and are as summarized below:  Bryan Olson is a 59 y.o. male       Assessment/Plan:   Principal Problem:   Sepsis (HCC) Active Problems:   Cellulitis   HLD (hyperlipidemia)   HTN (hypertension)   AKI (acute kidney injury) (HCC)   Lactic acidosis   Hypokalemia   Severe sepsis (fever, leukocytosis, tachypnea and tachycardia) due to left lower extremity cellulitis Acute kidney injury (complication of sepsis) Hypertension Hypokalemia Hyperlipidemia   PLAN  Continue empiric IV antibiotics Analgesics as needed for pain Obtain MRI with contrast of the left lower extremity for further evaluation Replete potassium Check magnesium level Monitor BMP   Body mass index is 45.13 kg/m.  Diet Order            Diet regular Room service appropriate? Yes; Fluid consistency: Thin  Diet effective now                      Medications:   . aspirin EC  81 mg Oral Daily  . atorvastatin  10 mg Oral Daily  . bisoprolol  10 mg Oral Daily  . enoxaparin (LOVENOX) injection  40 mg Subcutaneous Q12H  . losartan  100 mg Oral Daily  . potassium chloride  40 mEq Oral Q4H   Continuous Infusions: . sodium chloride 125 mL/hr at 06/25/20 0109  . cefTRIAXone (ROCEPHIN)  IV Stopped (06/24/20 2100)  . vancomycin 1,000 mg (06/25/20 0951)     Anti-infectives (From admission, onward)   Start     Dose/Rate Route Frequency Ordered Stop   06/25/20 2200  vancomycin (VANCOREADY) IVPB 1500 mg/300 mL  Status:  Discontinued        1,500 mg 150 mL/hr over 120 Minutes Intravenous Every 24 hours 06/24/20 2104 06/25/20 0836   06/25/20 2030  cefTRIAXone (ROCEPHIN) 1 g in sodium chloride 0.9 % 100 mL IVPB  Status:  Discontinued        1 g 200 mL/hr over 30 Minutes Intravenous Every 24 hours 06/25/20 0030 06/25/20  0038   06/25/20 1000  vancomycin (VANCOCIN) IVPB 1000 mg/200 mL premix        1,000 mg 200 mL/hr over 60 Minutes Intravenous Every 12 hours 06/25/20 0836     06/25/20 0045  vancomycin (VANCOCIN) IVPB 1000 mg/200 mL premix  Status:  Discontinued        1,000 mg 200 mL/hr over 60 Minutes Intravenous  Once 06/25/20 0030 06/25/20 0037   06/24/20 2045  vancomycin (VANCOREADY) IVPB 1500 mg/300 mL        1,500 mg 150 mL/hr over 120 Minutes Intravenous  Once 06/24/20 2042 06/24/20 2313   06/24/20 1945  cefTRIAXone (ROCEPHIN) 1 g in sodium chloride 0.9 % 100 mL IVPB        1 g 200 mL/hr over 30 Minutes Intravenous Every 24 hours 06/24/20 1940     06/24/20 1845  vancomycin (VANCOCIN) IVPB 1000 mg/200 mL premix        1,000 mg 200 mL/hr over 60 Minutes Intravenous  Once 06/24/20 1833 06/24/20 2059   06/24/20 1715  cefTRIAXone (ROCEPHIN) injection 1 g        1 g Intramuscular  Once 06/24/20 1712 06/24/20 1730  Family Communication/Anticipated D/C date and plan/Code Status   DVT prophylaxis: enoxaparin (LOVENOX) injection 40 mg Start: 06/25/20 1000     Code Status: Full Code  Family Communication: Plan discussed with his wife at the bedside Disposition Plan:    Status is: Inpatient  Remains inpatient appropriate because:IV treatments appropriate due to intensity of illness or inability to take PO   Dispo: The patient is from: Home              Anticipated d/c is to: Home              Anticipated d/c date is: > 3 days              Patient currently is not medically stable to d/c.           Subjective:   C/o swelling and moderate pain in the left leg.  He said redness has extended from the leg into the thigh.  He feels "lethargic".  No chest pain or shortness of breath.  Objective:    Vitals:   06/25/20 0000 06/25/20 0400 06/25/20 0500 06/25/20 0826  BP:   (!) 165/71 (!) 160/87  Pulse:    100  Resp:    (!) 28  Temp: (!) 101 F (38.3 C) 100.1 F (37.8  C)    TempSrc:      SpO2:    96%  Weight:      Height:       No data found.   Intake/Output Summary (Last 24 hours) at 06/25/2020 1240 Last data filed at 06/25/2020 0500 Gross per 24 hour  Intake 1300 ml  Output 1025 ml  Net 275 ml   Filed Weights   06/24/20 0902  Weight: 130.7 kg    Exam:  GEN: NAD SKIN: No rash EYES: EOMI ENT: MMM CV: RRR PULM: CTA B ABD: soft, ND, NT, +BS CNS: AAO x 3, non focal EXT: Swelling, tenderness and circumferential erythema of the left leg.  Erythema has extended upward to the medial aspect of the left thigh.                Data Reviewed:   I have personally reviewed following labs and imaging studies:  Labs: Labs show the following:   Basic Metabolic Panel: Recent Labs  Lab 06/24/20 0905 06/24/20 0905 06/24/20 1855 06/25/20 0504 06/25/20 0643  NA 139  --  140  --  138  K 3.2*   < > 3.6  --  3.0*  CL 101  --  100  --  105  CO2 25  --  25  --  20*  GLUCOSE 152*  --  111*  --  110*  BUN 19  --  24*  --  21*  CREATININE 1.52*  --  1.85* 1.64* 1.33*  CALCIUM 8.7*  --  8.7*  --  7.7*  MG  --   --  2.1  --   --    < > = values in this interval not displayed.   GFR Estimated Creatinine Clearance: 78.7 mL/min (A) (by C-G formula based on SCr of 1.33 mg/dL (H)). Liver Function Tests: Recent Labs  Lab 06/24/20 0905  AST 34  ALT 57*  ALKPHOS 63  BILITOT 2.6*  PROT 7.2  ALBUMIN 3.9   No results for input(s): LIPASE, AMYLASE in the last 168 hours. No results for input(s): AMMONIA in the last 168 hours. Coagulation profile No results for input(s): INR, PROTIME in the last 168  hours.  CBC: Recent Labs  Lab 06/24/20 0905 06/25/20 0643  WBC 17.0* 10.9*  NEUTROABS 15.0*  --   HGB 15.8 13.9  HCT 45.2 38.9*  MCV 85.9 85.3  PLT 166 126*   Cardiac Enzymes: No results for input(s): CKTOTAL, CKMB, CKMBINDEX, TROPONINI in the last 168 hours. BNP (last 3 results) No results for input(s): PROBNP in the last 8760  hours. CBG: Recent Labs  Lab 06/24/20 1904  GLUCAP 92   D-Dimer: No results for input(s): DDIMER in the last 72 hours. Hgb A1c: No results for input(s): HGBA1C in the last 72 hours. Lipid Profile: No results for input(s): CHOL, HDL, LDLCALC, TRIG, CHOLHDL, LDLDIRECT in the last 72 hours. Thyroid function studies: No results for input(s): TSH, T4TOTAL, T3FREE, THYROIDAB in the last 72 hours.  Invalid input(s): FREET3 Anemia work up: No results for input(s): VITAMINB12, FOLATE, FERRITIN, TIBC, IRON, RETICCTPCT in the last 72 hours. Sepsis Labs: Recent Labs  Lab 06/24/20 0905 06/24/20 1855 06/24/20 2055 06/25/20 0643  WBC 17.0*  --   --  10.9*  LATICACIDVEN 1.3 2.0* 1.3  --     Microbiology Recent Results (from the past 240 hour(s))  SARS Coronavirus 2 by RT PCR (hospital order, performed in [facility] hospital lab) Nasopharyngeal Nasopharyngeal Swab     Status: None   Collection Time: 06/24/20  7:50 PM   Specimen: Nasopharyngeal Swab  Result Value Ref Range Status   SARS Coronavirus 2 NEGATIVE NEGATIVE Final    Comment: (NOTE) SARS-CoV-2 target nucleic acids are NOT DETECTED.  The SARS-CoV-2 RNA is generally detectable in upper and lower respiratory specimens during the acute phase of infection. The lowest concentration of SARS-CoV-2 viral copies this assay can detect is 250 copies / mL. A negative result does not preclude SARS-CoV-2 infection and should not be used as the sole basis for treatment or other patient management decisions.  A negative result may occur with improper specimen collection / handling, submission of specimen other than nasopharyngeal swab, presence of viral mutation(s) within the areas targeted by this assay, and inadequate number of viral copies (<250 copies / mL). A negative result must be combined with clinical observations, patient history, and epidemiological information.  Fact Sheet for Patients:     BoilerBrush.com.cy  Fact Sheet for Healthcare Providers: https://pope.com/  This test is not yet approved or  cleared by the [place] FDA and has been authorized for detection and/or diagnosis of SARS-CoV-2 by FDA under an Emergency Use Authorization (EUA).  This EUA will remain in effect (meaning this test can be used) for the duration of the COVID-19 declaration under Section 564(b)(1) of the Act, 21 U.S.C. section 360bbb-3(b)(1), unless the authorization is terminated or revoked sooner.  Performed at [facility], [address] [place], [place] [address]   Culture, blood (single)     Status: None (Preliminary result)   Collection Time: 06/24/20  7:50 PM   Specimen: BLOOD  Result Value Ref Range Status   Specimen Description BLOOD LAC  Final   Special Requests BOTTLES DRAWN AEROBIC AND ANAEROBIC BCAV  Final   Culture   Final    NO GROWTH < 12 HOURS Performed at [facility], [address], [place], [place] [address]    Report Status PENDING  Incomplete    Procedures and diagnostic studies:  DG Chest 2 View  Result Date: 06/24/2020 CLINICAL DATA:  Fever. EXAM: CHEST - 2 VIEW COMPARISON:  06/16/2011. FINDINGS: Mediastinum and hilar structures normal. Heart size  normal. Mild left base infiltrate cannot be excluded. No pleural effusion or pneumothorax. Degenerative change thoracic spine. IMPRESSION: Mild left base infiltrate cannot be excluded. Electronically Signed   By: Maisie Fus  Register   On: 06/24/2020 09:30   DG Tibia/Fibula Left  Result Date: 06/24/2020 CLINICAL DATA:  Pain infection EXAM: LEFT TIBIA AND FIBULA - 2 VIEW COMPARISON:  None. FINDINGS: No fracture or malalignment. No periostitis or bone destruction. Soft tissue edema without emphysema or radiopaque foreign body IMPRESSION: Soft tissue edema. No acute osseous abnormality. Electronically Signed   By: Jasmine Pang M.D.   On: 06/24/2020  20:14   US Venous Img Lower Unilateral Left  Result Date: 06/24/2020 CLINICAL DATA:  Left leg pain, edema, and erythema for 1 week EXAM: LEFT LOWER EXTREMITY VENOUS DOPPLER ULTRASOUND TECHNIQUE: Gray-scale sonography with compression, as well as color and duplex ultrasound, were performed to evaluate the deep venous system(s) from the level of the common femoral vein through the popliteal and proximal calf veins. COMPARISON:  None. FINDINGS: VENOUS Normal compressibility of the common femoral, superficial femoral, and popliteal veins, as well as the visualized calf veins. Visualized portions of profunda femoral vein and great saphenous vein unremarkable. No filling defects to suggest DVT on grayscale or color Doppler imaging. Doppler waveforms show normal direction of venous flow, normal respiratory plasticity and response to augmentation. Limited views of the contralateral common femoral vein are unremarkable. OTHER Borderline enlarged lymph nodes in the left inguinal region measure up to 16 mm in short axis, likely reactive. Limitations: none IMPRESSION: 1. No evidence of deep venous thrombosis within the left lower extremity. 2. Likely reactive lymph nodes within the left inguinal region. Electronically Signed   By: Sharlet Salina M.D.   On: 06/24/2020 18:29               LOS: 1 day   Yared Susan  [facility]   Pager on [email or web address]. If 7PM-7AM, please contact night-coverage at [email or web address]     06/25/2020, 12:40 PM

## 2020-06-25 NOTE — ED Notes (Signed)
Back from MRI.

## 2020-06-26 LAB — CBC WITH DIFFERENTIAL/PLATELET
Abs Immature Granulocytes: 0.03 10*3/uL (ref 0.00–0.07)
Basophils Absolute: 0 10*3/uL (ref 0.0–0.1)
Basophils Relative: 0 %
Eosinophils Absolute: 0.1 10*3/uL (ref 0.0–0.5)
Eosinophils Relative: 1 %
HCT: 37.9 % — ABNORMAL LOW (ref 39.0–52.0)
Hemoglobin: 12.8 g/dL — ABNORMAL LOW (ref 13.0–17.0)
Immature Granulocytes: 0 %
Lymphocytes Relative: 12 %
Lymphs Abs: 1 10*3/uL (ref 0.7–4.0)
MCH: 29.8 pg (ref 26.0–34.0)
MCHC: 33.8 g/dL (ref 30.0–36.0)
MCV: 88.1 fL (ref 80.0–100.0)
Monocytes Absolute: 0.9 10*3/uL (ref 0.1–1.0)
Monocytes Relative: 11 %
Neutro Abs: 6.4 10*3/uL (ref 1.7–7.7)
Neutrophils Relative %: 76 %
Platelets: 131 10*3/uL — ABNORMAL LOW (ref 150–400)
RBC: 4.3 MIL/uL (ref 4.22–5.81)
RDW: 14 % (ref 11.5–15.5)
WBC: 8.5 10*3/uL (ref 4.0–10.5)
nRBC: 0 % (ref 0.0–0.2)

## 2020-06-26 LAB — BASIC METABOLIC PANEL
Anion gap: 10 (ref 5–15)
BUN: 15 mg/dL (ref 6–20)
CO2: 22 mmol/L (ref 22–32)
Calcium: 8.1 mg/dL — ABNORMAL LOW (ref 8.9–10.3)
Chloride: 108 mmol/L (ref 98–111)
Creatinine, Ser: 1.04 mg/dL (ref 0.61–1.24)
GFR calc Af Amer: >60 mL/min (ref >60)
GFR calc non Af Amer: >60 mL/min (ref >60)
Glucose, Bld: 119 mg/dL — ABNORMAL HIGH (ref 70–99)
Potassium: 3.5 mmol/L (ref 3.5–5.1)
Sodium: 140 mmol/L (ref 135–145)

## 2020-06-26 MED ORDER — ONDANSETRON HCL 4 MG/2ML IJ SOLN
4.0000 mg | Freq: Four times a day (QID) | INTRAMUSCULAR | Status: DC | PRN
  Administered 2020-06-26 (×2): 4 mg via INTRAVENOUS
  Filled 2020-06-26 (×3): qty 2

## 2020-06-26 MED ORDER — POTASSIUM CHLORIDE CRYS ER 20 MEQ PO TBCR
40.0000 meq | EXTENDED_RELEASE_TABLET | Freq: Once | ORAL | Status: AC
  Administered 2020-06-26: 40 meq via ORAL
  Filled 2020-06-26: qty 2

## 2020-06-26 NOTE — Progress Notes (Addendum)
Progress Note    Bryan Olson  DXA:[id number] DOB: [birth year]  DOA: 06/24/2020 PCP: Patrice Paradise, MD      Brief Narrative:    Medical records reviewed and are as summarized below:  Bryan Olson is a 59 y.o. male       Assessment/Plan:   Principal Problem:   Sepsis (HCC) Active Problems:   Cellulitis   HLD (hyperlipidemia)   HTN (hypertension)   AKI (acute kidney injury) (HCC)   Lactic acidosis   Hypokalemia   Severe sepsis (fever, leukocytosis, tachypnea and tachycardia) due to left lower extremity cellulitis Acute kidney injury (complication of sepsis)-improved Hypertension Hypokalemia Hyperlipidemia Mild thrombocytopenia Morbid obesity  PLAN  No evidence of abscess, necrotizing fasciitis or osteomyelitis on MRI of the left lower extremity Continue IV vancomycin and Rocephin Tylenol/oxycodone as needed for pain Continue to replete potassium  Monitor BMP and CBC Monitor kidney function on vancomycin   Body mass index is 45.13 kg/m.  (Morbid obesity): This complicates overall care and prognosis  Diet Order            Diet regular Room service appropriate? Yes; Fluid consistency: Thin  Diet effective now                      Medications:   . aspirin EC  81 mg Oral Daily  . atorvastatin  10 mg Oral Daily  . bisoprolol  10 mg Oral Daily  . enoxaparin (LOVENOX) injection  40 mg Subcutaneous Q12H  . losartan  100 mg Oral Daily   Continuous Infusions: . cefTRIAXone (ROCEPHIN)  IV Stopped (06/25/20 1908)  . vancomycin 1,000 mg (06/26/20 0916)     Anti-infectives (From admission, onward)   Start     Dose/Rate Route Frequency Ordered Stop   06/25/20 2200  vancomycin (VANCOREADY) IVPB 1500 mg/300 mL  Status:  Discontinued        1,500 mg 150 mL/hr over 120 Minutes Intravenous Every 24 hours 06/24/20 2104 06/25/20 0836   06/25/20 2030  cefTRIAXone (ROCEPHIN) 1 g in sodium chloride 0.9 % 100 mL IVPB  Status:   Discontinued        1 g 200 mL/hr over 30 Minutes Intravenous Every 24 hours 06/25/20 0030 06/25/20 0038   06/25/20 1000  vancomycin (VANCOCIN) IVPB 1000 mg/200 mL premix        1,000 mg 200 mL/hr over 60 Minutes Intravenous Every 12 hours 06/25/20 0836     06/25/20 0045  vancomycin (VANCOCIN) IVPB 1000 mg/200 mL premix  Status:  Discontinued        1,000 mg 200 mL/hr over 60 Minutes Intravenous  Once 06/25/20 0030 06/25/20 0037   06/24/20 2045  vancomycin (VANCOREADY) IVPB 1500 mg/300 mL        1,500 mg 150 mL/hr over 120 Minutes Intravenous  Once 06/24/20 2042 06/24/20 2313   06/24/20 1945  cefTRIAXone (ROCEPHIN) 1 g in sodium chloride 0.9 % 100 mL IVPB        1 g 200 mL/hr over 30 Minutes Intravenous Every 24 hours 06/24/20 1940     06/24/20 1845  vancomycin (VANCOCIN) IVPB 1000 mg/200 mL premix        1,000 mg 200 mL/hr over 60 Minutes Intravenous  Once 06/24/20 1833 06/24/20 2059   06/24/20 1715  cefTRIAXone (ROCEPHIN) injection 1 g        1 g Intramuscular  Once 06/24/20 1712 06/24/20 1730  Family Communication/Anticipated D/C date and plan/Code Status   DVT prophylaxis: enoxaparin (LOVENOX) injection 40 mg Start: 06/25/20 1000     Code Status: Full Code  Family Communication: Plan discussed with his wife at the bedside Disposition Plan:    Status is: Inpatient  Remains inpatient appropriate because:IV treatments appropriate due to intensity of illness or inability to take PO   Dispo: The patient is from: Home              Anticipated d/c is to: Home              Anticipated d/c date is: > 3 days              Patient currently is not medically stable to d/c.           Subjective:   C/o pain in the left leg.  He thinks the pain and swelling a little better today.  Redness in the left thigh has disappeared  Objective:    Vitals:   06/25/20 2045 06/26/20 0005 06/26/20 0456 06/26/20 1240  BP: 131/66 (!) 110/51 (!) 151/87 125/76    Pulse: 87 92 90 74  Resp: (!) 24  Temp: 99 F (37.2 C) (!) 100.5 F (38.1 C) 99.7 F (37.6 C) 98.6 F (37 C)  TempSrc: Oral Oral Oral Oral  SpO2: 93% 93% 93% 95%  Weight:      Height:       No data found.   Intake/Output Summary (Last 24 hours) at 06/26/2020 1504 Last data filed at 06/26/2020 0503 Gross per 24 hour  Intake 2194.54 ml  Output 700 ml  Net 1494.54 ml   Filed Weights   06/24/20 0902  Weight: 130.7 kg    Exam:  GEN: No acute distress SKIN: Warm and dry EYES: EOMI ENT: MMM CV: RRR PULM: Mild expiratory wheezing, no rales heard ABD: soft, obese , NT, +BS CNS: AAO x 3, non focal EXT: Swelling, tenderness and circumferential erythema of the left leg.  Erythema on the left leg is more pronounced but erythema on the left thigh has improved.                 Data Reviewed:   I have personally reviewed following labs and imaging studies:  Labs: Labs show the following:   Basic Metabolic Panel: Recent Labs  Lab 06/24/20 0905 06/24/20 0905 06/24/20 1855 06/24/20 1855 06/25/20 0504 06/25/20 0643 06/26/20 0646  NA 139  --  140  --   --  138 140  K 3.2*   < > 3.6   < >  --  3.0* 3.5  CL 101  --  100  --   --  105 108  CO2 25  --  25  --   --  20* 22  GLUCOSE 152*  --  111*  --   --  110* 119*  BUN 19  --  24*  --   --  21* 15  CREATININE 1.52*  --  1.85*  --  1.64* 1.33* 1.04  CALCIUM 8.7*  --  8.7*  --   --  7.7* 8.1*  MG  --   --  2.1  --  2.0  --   --    < > = values in this interval not displayed.   GFR Estimated Creatinine Clearance: 100.6 mL/min (by C-G formula based on SCr of 1.04 mg/dL). Liver Function Tests: Recent Labs  Lab 06/24/20 0905  AST  34  ALT 57*  ALKPHOS 63  BILITOT 2.6*  PROT 7.2  ALBUMIN 3.9   No results for input(s): LIPASE, AMYLASE in the last 168 hours. No results for input(s): AMMONIA in the last 168 hours. Coagulation profile No results for input(s): INR, PROTIME in the last 168  hours.  CBC: Recent Labs  Lab 06/24/20 0905 06/25/20 0643 06/26/20 0646  WBC 17.0* 10.9* 8.5  NEUTROABS 15.0*  --  6.4  HGB 15.8 13.9 12.8*  HCT 45.2 38.9* 37.9*  MCV 85.9 85.3 88.1  PLT 166 126* 131*   Cardiac Enzymes: No results for input(s): CKTOTAL, CKMB, CKMBINDEX, TROPONINI in the last 168 hours. BNP (last 3 results) No results for input(s): PROBNP in the last 8760 hours. CBG: Recent Labs  Lab 06/24/20 1904  GLUCAP 92   D-Dimer: No results for input(s): DDIMER in the last 72 hours. Hgb A1c: No results for input(s): HGBA1C in the last 72 hours. Lipid Profile: No results for input(s): CHOL, HDL, LDLCALC, TRIG, CHOLHDL, LDLDIRECT in the last 72 hours. Thyroid function studies: No results for input(s): TSH, T4TOTAL, T3FREE, THYROIDAB in the last 72 hours.  Invalid input(s): FREET3 Anemia work up: No results for input(s): VITAMINB12, FOLATE, FERRITIN, TIBC, IRON, RETICCTPCT in the last 72 hours. Sepsis Labs: Recent Labs  Lab 06/24/20 0905 06/24/20 1855 06/24/20 2055 06/25/20 0643 06/26/20 0646  WBC 17.0*  --   --  10.9* 8.5  LATICACIDVEN 1.3 2.0* 1.3  --   --     Microbiology Recent Results (from the past 240 hour(s))  SARS Coronavirus 2 by RT PCR (hospital order, performed in [facility] hospital lab) Nasopharyngeal Nasopharyngeal Swab     Status: None   Collection Time: 06/24/20  7:50 PM   Specimen: Nasopharyngeal Swab  Result Value Ref Range Status   SARS Coronavirus 2 NEGATIVE NEGATIVE Final    Comment: (NOTE) SARS-CoV-2 target nucleic acids are NOT DETECTED.  The SARS-CoV-2 RNA is generally detectable in upper and lower respiratory specimens during the acute phase of infection. The lowest concentration of SARS-CoV-2 viral copies this assay can detect is 250 copies / mL. A negative result does not preclude SARS-CoV-2 infection and should not be used as the sole basis for treatment or other patient management decisions.  A negative result may occur  with improper specimen collection / handling, submission of specimen other than nasopharyngeal swab, presence of viral mutation(s) within the areas targeted by this assay, and inadequate number of viral copies (<250 copies / mL). A negative result must be combined with clinical observations, patient history, and epidemiological information.  Fact Sheet for Patients:   BoilerBrush.com.cyhttps://www.fda.gov/media/136312/download  Fact Sheet for Healthcare Providers: https://pope.com/https://www.fda.gov/media/136313/download  This test is not yet approved or  cleared by the [place] FDA and has been authorized for detection and/or diagnosis of SARS-CoV-2 by FDA under an Emergency Use Authorization (EUA).  This EUA will remain in effect (meaning this test can be used) for the duration of the COVID-19 declaration under Section 564(b)(1) of the Act, 21 U.S.C. section 360bbb-3(b)(1), unless the authorization is terminated or revoked sooner.  Performed at [facility], [address] [place], [place] [address]   Culture, blood (single)     Status: None (Preliminary result)   Collection Time: 06/24/20  7:50 PM   Specimen: BLOOD  Result Value Ref Range Status   Specimen Description BLOOD LAC  Final   Special Requests BOTTLES DRAWN AEROBIC AND ANAEROBIC BCAV  Final   Culture   Final  NO GROWTH 2 DAYS Performed at [facility], [address] [place], [place] [address]    Report Status PENDING  Incomplete    Procedures and diagnostic studies:  DG Tibia/Fibula Left  Result Date: 06/24/2020 CLINICAL DATA:  Pain infection EXAM: LEFT TIBIA AND FIBULA - 2 VIEW COMPARISON:  None. FINDINGS: No fracture or malalignment. No periostitis or bone destruction. Soft tissue edema without emphysema or radiopaque foreign body IMPRESSION: Soft tissue edema. No acute osseous abnormality. Electronically Signed   By: Jasmine Pang M.D.   On: 06/24/2020 20:14   MR TIBIA FIBULA LEFT W WO CONTRAST  Result Date:  06/25/2020 CLINICAL DATA:  Left lower extremity redness and swelling EXAM: MRI OF LOWER LEFT EXTREMITY WITHOUT AND WITH CONTRAST TECHNIQUE: Multiplanar, multisequence MR imaging of the left tibia and fibula was performed both before and after administration of intravenous contrast. CONTRAST:  68mL GADAVIST GADOBUTROL 1 MMOL/ML IV SOLN COMPARISON:  X-ray 06/24/2020 FINDINGS: Bones/Joint/Cartilage No acute fracture or dislocation. No bone marrow edema. No periostitis. There is a multilobulated cystic structure at the anterior left knee joint line adjacent to the anterior horn of the lateral meniscus measuring 2.2 x 1.8 cm (series 11, image 9) which could reflect a ganglion cyst or parameniscal cyst. Evaluation for internal derangement is limited on large field-of-view sequences. Ligaments Visualized ligaments at the knee and ankle appear grossly intact. Muscles and Tendons Preserved muscle bulk. No intramuscular edema or fluid. No deep fascial fluid between the intramuscular compartments of the left lower leg. Intact tendinous structures. No tenosynovial fluid. Soft tissues Mild-to-moderate circumferential subcutaneous edema of the left lower leg. No organized or rim enhancing fluid collection. No deep soft tissue ulceration. IMPRESSION: 1. Mild-to-moderate circumferential subcutaneous edema of the left lower leg, nonspecific but may reflect cellulitis. No organized or rim enhancing fluid collection. 2. No deep soft tissue or perifascial edema. 3. No evidence of osteomyelitis. 4. Multilobulated cystic structure at the anterior left knee joint line adjacent to the anterior horn of the lateral meniscus measuring 2.2 x 1.8 cm which could reflect a ganglion cyst or parameniscal cyst. Evaluation for internal derangement is limited on large field-of-view sequences. Electronically Signed   By: Duanne Guess D.O.   On: 06/25/2020 13:22   US Venous Img Lower Unilateral Left  Result Date: 06/24/2020 CLINICAL DATA:  Left  leg pain, edema, and erythema for 1 week EXAM: LEFT LOWER EXTREMITY VENOUS DOPPLER ULTRASOUND TECHNIQUE: Gray-scale sonography with compression, as well as color and duplex ultrasound, were performed to evaluate the deep venous system(s) from the level of the common femoral vein through the popliteal and proximal calf veins. COMPARISON:  None. FINDINGS: VENOUS Normal compressibility of the common femoral, superficial femoral, and popliteal veins, as well as the visualized calf veins. Visualized portions of profunda femoral vein and great saphenous vein unremarkable. No filling defects to suggest DVT on grayscale or color Doppler imaging. Doppler waveforms show normal direction of venous flow, normal respiratory plasticity and response to augmentation. Limited views of the contralateral common femoral vein are unremarkable. OTHER Borderline enlarged lymph nodes in the left inguinal region measure up to 16 mm in short axis, likely reactive. Limitations: none IMPRESSION: 1. No evidence of deep venous thrombosis within the left lower extremity. 2. Likely reactive lymph nodes within the left inguinal region. Electronically Signed   By: Sharlet Salina M.D.   On: 06/24/2020 18:29               LOS: 2 days  Trason Shifflet  [facility] [employer] loss adjuster on [email or web address]. If 7PM-7AM, please contact night-coverage at [email or web address]     06/26/2020, 3:04 PM

## 2020-06-26 NOTE — Progress Notes (Signed)
Pharmacy Antibiotic Note  Bryan Olson is a 59 y.o. male admitted on 06/24/2020 with cellulitis.  Pharmacy has been consulted for Vancomycin dosing.  Plan: Patient received vancomycin 2500mg  IV loading dose in the ED  Renal function improving with fluid resuscitation.  -Will continue vancomycin maintenance dose to 1000 mg q12h per nomogram  (weight 130.7 kg  Scr 1.04, Normalized Crcl 79 ml/min)  Continue to monitor daily Scr while on vancomycin  Height: 5\' 7"  (170.2 cm) Weight: 130.7 kg (288 lb 2.3 oz) IBW/kg (Calculated) : 66.1  Temp (24hrs), Avg:99.7 F (37.6 C), Min:98.6 F (37 C), Max:100.5 F (38.1 C)  Recent Labs  Lab 06/24/20 0905 06/24/20 1855 06/24/20 2055 06/25/20 0504 06/25/20 0643 06/26/20 0646  WBC 17.0*  --   --   --  10.9* 8.5  CREATININE 1.52* 1.85*  --  1.64* 1.33* 1.04  LATICACIDVEN 1.3 2.0* 1.3  --   --   --     Estimated Creatinine Clearance: 100.6 mL/min (by C-G formula based on SCr of 1.04 mg/dL).    No Known Allergies  Antimicrobials this admission: Vancomycin 8/19 >>  Ceftriaxone 8/19 >>  Dose adjustments this admission: 8/20: Changed vancomycin maintenance regimen from vancomycin 1500 mg q24h to vancomycin 1000 mg q12h based on improving renal function  Microbiology results: 8/19 BCx (one set): NG < 12h  Thank you for allowing pharmacy to be a part of this patient's care.  Charice Zuno A 06/26/2020 2:52 PM

## 2020-06-27 LAB — CBC WITH DIFFERENTIAL/PLATELET
Abs Immature Granulocytes: 0.05 10*3/uL (ref 0.00–0.07)
Basophils Absolute: 0 10*3/uL (ref 0.0–0.1)
Basophils Relative: 1 %
Eosinophils Absolute: 0.2 10*3/uL (ref 0.0–0.5)
Eosinophils Relative: 2 %
HCT: 37.7 % — ABNORMAL LOW (ref 39.0–52.0)
Hemoglobin: 13.3 g/dL (ref 13.0–17.0)
Immature Granulocytes: 1 %
Lymphocytes Relative: 13 %
Lymphs Abs: 1.2 10*3/uL (ref 0.7–4.0)
MCH: 30.1 pg (ref 26.0–34.0)
MCHC: 35.3 g/dL (ref 30.0–36.0)
MCV: 85.3 fL (ref 80.0–100.0)
Monocytes Absolute: 0.9 10*3/uL (ref 0.1–1.0)
Monocytes Relative: 10 %
Neutro Abs: 6.3 10*3/uL (ref 1.7–7.7)
Neutrophils Relative %: 73 %
Platelets: 148 10*3/uL — ABNORMAL LOW (ref 150–400)
RBC: 4.42 MIL/uL (ref 4.22–5.81)
RDW: 13.7 % (ref 11.5–15.5)
WBC: 8.6 10*3/uL (ref 4.0–10.5)
nRBC: 0 % (ref 0.0–0.2)

## 2020-06-27 LAB — CREATININE, SERUM
Creatinine, Ser: 1.05 mg/dL (ref 0.61–1.24)
GFR calc Af Amer: >60 mL/min (ref >60)
GFR calc non Af Amer: >60 mL/min (ref >60)

## 2020-06-27 LAB — MRSA PCR SCREENING: MRSA by PCR: NEGATIVE

## 2020-06-27 MED ORDER — IPRATROPIUM-ALBUTEROL 0.5-2.5 (3) MG/3ML IN SOLN
3.0000 mL | RESPIRATORY_TRACT | Status: DC | PRN
  Administered 2020-06-27: 3 mL via RESPIRATORY_TRACT
  Filled 2020-06-27: qty 3

## 2020-06-27 MED ORDER — SODIUM CHLORIDE 0.9 % IV SOLN
INTRAVENOUS | Status: DC | PRN
  Administered 2020-06-27 (×3): 250 mL via INTRAVENOUS

## 2020-06-27 NOTE — Progress Notes (Addendum)
Progress Note    Bryan Olson  AUQ:[id number] DOB: [birth year]  DOA: 06/24/2020 PCP: Patrice Paradise, MD      Brief Narrative:    Medical records reviewed and are as summarized below:  Bryan Olson is a 59 y.o. male       Assessment/Plan:   Principal Problem:   Sepsis (HCC) Active Problems:   Cellulitis   HLD (hyperlipidemia)   HTN (hypertension)   AKI (acute kidney injury) (HCC)   Lactic acidosis   Hypokalemia   Severe sepsis (fever, leukocytosis, tachypnea and tachycardia) due to left lower extremity cellulitis-improving Acute kidney injury (complication of sepsis)-improved Hypertension Hypokalemia Hyperlipidemia Mild thrombocytopenia Morbid obesity  PLAN  Continue IV vancomycin and Rocephin for now given the severity of left leg cellulitis Analgesics as needed for pain Continue antihypertensives   Body mass index is 45.13 kg/m.  (Morbid obesity): This complicates overall care and prognosis  Diet Order            Diet regular Room service appropriate? Yes; Fluid consistency: Thin  Diet effective now                      Medications:   . aspirin EC  81 mg Oral Daily  . atorvastatin  10 mg Oral Daily  . bisoprolol  10 mg Oral Daily  . enoxaparin (LOVENOX) injection  40 mg Subcutaneous Q12H  . losartan  100 mg Oral Daily   Continuous Infusions: . sodium chloride 250 mL (06/27/20 1005)  . cefTRIAXone (ROCEPHIN)  IV Stopped (06/26/20 1916)  . vancomycin 1,000 mg (06/27/20 1006)     Anti-infectives (From admission, onward)   Start     Dose/Rate Route Frequency Ordered Stop   06/25/20 2200  vancomycin (VANCOREADY) IVPB 1500 mg/300 mL  Status:  Discontinued        1,500 mg 150 mL/hr over 120 Minutes Intravenous Every 24 hours 06/24/20 2104 06/25/20 0836   06/25/20 2030  cefTRIAXone (ROCEPHIN) 1 g in sodium chloride 0.9 % 100 mL IVPB  Status:  Discontinued        1 g 200 mL/hr over 30 Minutes Intravenous Every 24  hours 06/25/20 0030 06/25/20 0038   06/25/20 1000  vancomycin (VANCOCIN) IVPB 1000 mg/200 mL premix        1,000 mg 200 mL/hr over 60 Minutes Intravenous Every 12 hours 06/25/20 0836     06/25/20 0045  vancomycin (VANCOCIN) IVPB 1000 mg/200 mL premix  Status:  Discontinued        1,000 mg 200 mL/hr over 60 Minutes Intravenous  Once 06/25/20 0030 06/25/20 0037   06/24/20 2045  vancomycin (VANCOREADY) IVPB 1500 mg/300 mL        1,500 mg 150 mL/hr over 120 Minutes Intravenous  Once 06/24/20 2042 06/24/20 2313   06/24/20 1945  cefTRIAXone (ROCEPHIN) 1 g in sodium chloride 0.9 % 100 mL IVPB        1 g 200 mL/hr over 30 Minutes Intravenous Every 24 hours 06/24/20 1940     06/24/20 1845  vancomycin (VANCOCIN) IVPB 1000 mg/200 mL premix        1,000 mg 200 mL/hr over 60 Minutes Intravenous  Once 06/24/20 1833 06/24/20 2059   06/24/20 1715  cefTRIAXone (ROCEPHIN) injection 1 g        1 g Intramuscular  Once 06/24/20 1712 06/24/20 1730             Family Communication/Anticipated D/C date and  plan/Code Status   DVT prophylaxis: enoxaparin (LOVENOX) injection 40 mg Start: 06/25/20 1000     Code Status: Full Code  Family Communication: Plan discussed with his wife at the bedside Disposition Plan:    Status is: Inpatient  Remains inpatient appropriate because:IV treatments appropriate due to intensity of illness or inability to take PO   Dispo: The patient is from: Home              Anticipated d/c is to: Home              Anticipated d/c date is: > 3 days              Patient currently is not medically stable to d/c.           Subjective:   He still has pain in the left leg but he thinks is slowly improving.  He was able to stand on this foot for the first time today since admission.  He has a cough productive of greenish sputum.  No shortness of breath, wheezing or chest pain.  Objective:    Vitals:   06/26/20 0456 06/26/20 1240 06/26/20 1952 06/27/20 0509  BP:  (!) 151/87 125/76 113/64 (!) 141/75  Pulse: 90 74 87 88  Resp: 20 (!) 24 20 20   Temp: 99.7 F (37.6 C) 98.6 F (37 C) 99.5 F (37.5 C) 100 F (37.8 C)  TempSrc: Oral Oral Oral Oral  SpO2: 93% 95% 98% 93%  Weight:      Height:       No data found.   Intake/Output Summary (Last 24 hours) at 06/27/2020 1142 Last data filed at 06/27/2020 0900 Gross per 24 hour  Intake 309.03 ml  Output 800 ml  Net -490.97 ml   Filed Weights   06/24/20 0902  Weight: 130.7 kg    Exam:  GEN: No acute distress SKIN: Warm and dry EYES: No pallor or icterus ENT: MMM CV: RRR PULM: Clear to auscultation bilaterally ABD: Soft and nontender CNS: Alert and oriented EXT: Tenderness of the left leg has improved.  However, erythema and swelling of the left leg remains unchanged.  Tenderness and erythema of the left thigh have resolved                 Data Reviewed:   I have personally reviewed following labs and imaging studies:  Labs: Labs show the following:   Basic Metabolic Panel: Recent Labs  Lab 06/24/20 0905 06/24/20 0905 06/24/20 1855 06/24/20 1855 06/25/20 0504 06/25/20 0643 06/26/20 0646 06/27/20 0811  NA 139  --  140  --   --  138 140  --   K 3.2*   < > 3.6   < >  --  3.0* 3.5  --   CL 101  --  100  --   --  105 108  --   CO2 25  --  25  --   --  20* 22  --   GLUCOSE 152*  --  111*  --   --  110* 119*  --   BUN 19  --  24*  --   --  21* 15  --   CREATININE 1.52*   < > 1.85*  --  1.64* 1.33* 1.04 1.05  CALCIUM 8.7*  --  8.7*  --   --  7.7* 8.1*  --   MG  --   --  2.1  --  2.0  --   --   --    < > =  values in this interval not displayed.   GFR Estimated Creatinine Clearance: 99.7 mL/min (by C-G formula based on SCr of 1.05 mg/dL). Liver Function Tests: Recent Labs  Lab 06/24/20 0905  AST 34  ALT 57*  ALKPHOS 63  BILITOT 2.6*  PROT 7.2  ALBUMIN 3.9   No results for input(s): LIPASE, AMYLASE in the last 168 hours. No results for input(s): AMMONIA in  the last 168 hours. Coagulation profile No results for input(s): INR, PROTIME in the last 168 hours.  CBC: Recent Labs  Lab 06/24/20 0905 06/25/20 0643 06/26/20 0646 06/27/20 0811  WBC 17.0* 10.9* 8.5 8.6  NEUTROABS 15.0*  --  6.4 6.3  HGB 15.8 13.9 12.8* 13.3  HCT 45.2 38.9* 37.9* 37.7*  MCV 85.9 85.3 88.1 85.3  PLT 166 126* 131* 148*   Cardiac Enzymes: No results for input(s): CKTOTAL, CKMB, CKMBINDEX, TROPONINI in the last 168 hours. BNP (last 3 results) No results for input(s): PROBNP in the last 8760 hours. CBG: Recent Labs  Lab 06/24/20 1904  GLUCAP 92   D-Dimer: No results for input(s): DDIMER in the last 72 hours. Hgb A1c: No results for input(s): HGBA1C in the last 72 hours. Lipid Profile: No results for input(s): CHOL, HDL, LDLCALC, TRIG, CHOLHDL, LDLDIRECT in the last 72 hours. Thyroid function studies: No results for input(s): TSH, T4TOTAL, T3FREE, THYROIDAB in the last 72 hours.  Invalid input(s): FREET3 Anemia work up: No results for input(s): VITAMINB12, FOLATE, FERRITIN, TIBC, IRON, RETICCTPCT in the last 72 hours. Sepsis Labs: Recent Labs  Lab 06/24/20 0905 06/24/20 1855 06/24/20 2055 06/25/20 0643 06/26/20 0646 06/27/20 0811  WBC 17.0*  --   --  10.9* 8.5 8.6  LATICACIDVEN 1.3 2.0* 1.3  --   --   --     Microbiology Recent Results (from the past 240 hour(s))  SARS Coronavirus 2 by RT PCR (hospital order, [facility]) Nasopharyngeal Nasopharyngeal Swab     Status: None   Collection Time: 06/24/20  7:50 PM   Specimen: Nasopharyngeal Swab  Result Value Ref Range Status   SARS Coronavirus 2 NEGATIVE NEGATIVE Final    Comment: (NOTE) SARS-CoV-2 target nucleic acids are NOT DETECTED.  The SARS-CoV-2 RNA is generally detectable in upper and lower respiratory specimens during the acute phase of infection. The lowest concentration of SARS-CoV-2 viral copies this assay can detect is 250 copies / mL. A negative result  does not preclude SARS-CoV-2 infection and should not be used as the sole basis for treatment or other patient management decisions.  A negative result may occur with improper specimen collection / handling, submission of specimen other than nasopharyngeal swab, presence of viral mutation(s) within the areas targeted by this assay, and inadequate number of viral copies (<250 copies / mL). A negative result must be combined with clinical observations, patient history, and epidemiological information.  Fact Sheet for Patients:   BoilerBrush.com.cyhttps://www.fda.gov/media/136312/download  Fact Sheet for Healthcare Providers: https://pope.com/https://www.fda.gov/media/136313/download  This test is not yet approved or  cleared by the [place] FDA and has been authorized for detection and/or diagnosis of SARS-CoV-2 by FDA under an Emergency Use Authorization (EUA).  This EUA will remain in effect (meaning this test can be used) for the duration of the COVID-19 declaration under Section 564(b)(1) of the Act, 21 U.S.C. section 360bbb-3(b)(1), unless the authorization is terminated or revoked sooner.  Performed at [facility], [address] [place], [place] [address]   Culture, blood (single)  Status: None (Preliminary result)   Collection Time: 06/24/20  7:50 PM   Specimen: BLOOD  Result Value Ref Range Status   Specimen Description BLOOD LAC  Final   Special Requests BOTTLES DRAWN AEROBIC AND ANAEROBIC BCAV  Final   Culture   Final    NO GROWTH 3 DAYS Performed at [facility], [address], [place], [place] [address]    Report Status PENDING  Incomplete    Procedures and diagnostic studies:  MR TIBIA FIBULA LEFT W WO CONTRAST  Result Date: 06/25/2020 CLINICAL DATA:  Left lower extremity redness and swelling EXAM: MRI OF LOWER LEFT EXTREMITY WITHOUT AND WITH CONTRAST TECHNIQUE: Multiplanar, multisequence MR imaging of the left tibia and fibula was performed both before and after  administration of intravenous contrast. CONTRAST:  48mL GADAVIST GADOBUTROL 1 MMOL/ML IV SOLN COMPARISON:  X-ray 06/24/2020 FINDINGS: Bones/Joint/Cartilage No acute fracture or dislocation. No bone marrow edema. No periostitis. There is a multilobulated cystic structure at the anterior left knee joint line adjacent to the anterior horn of the lateral meniscus measuring 2.2 x 1.8 cm (series 11, image 9) which could reflect a ganglion cyst or parameniscal cyst. Evaluation for internal derangement is limited on large field-of-view sequences. Ligaments Visualized ligaments at the knee and ankle appear grossly intact. Muscles and Tendons Preserved muscle bulk. No intramuscular edema or fluid. No deep fascial fluid between the intramuscular compartments of the left lower leg. Intact tendinous structures. No tenosynovial fluid. Soft tissues Mild-to-moderate circumferential subcutaneous edema of the left lower leg. No organized or rim enhancing fluid collection. No deep soft tissue ulceration. IMPRESSION: 1. Mild-to-moderate circumferential subcutaneous edema of the left lower leg, nonspecific but may reflect cellulitis. No organized or rim enhancing fluid collection. 2. No deep soft tissue or perifascial edema. 3. No evidence of osteomyelitis. 4. Multilobulated cystic structure at the anterior left knee joint line adjacent to the anterior horn of the lateral meniscus measuring 2.2 x 1.8 cm which could reflect a ganglion cyst or parameniscal cyst. Evaluation for internal derangement is limited on large field-of-view sequences. Electronically Signed   By: Duanne Guess D.O.   On: 06/25/2020 13:22               LOS: 3 days   Ataya Murdy  [facility]   Pager on [email or web address]. If 7PM-7AM, please contact night-coverage at [email or web address]     06/27/2020, 11:42 AM

## 2020-06-28 LAB — CREATININE, SERUM
Creatinine, Ser: 0.88 mg/dL (ref 0.61–1.24)
GFR calc Af Amer: >60 mL/min (ref >60)
GFR calc non Af Amer: >60 mL/min (ref >60)

## 2020-06-28 MED ORDER — VANCOMYCIN HCL 1250 MG/250ML IV SOLN
1250.0000 mg | Freq: Two times a day (BID) | INTRAVENOUS | Status: DC
  Filled 2020-06-28: qty 250

## 2020-06-28 NOTE — Progress Notes (Signed)
Mobility Specialist - Progress Note   06/28/20 1119  Mobility  Activity Ambulated in room;Ambulated in hall  Level of Assistance Independent  Assistive Device None  Distance Ambulated (ft) 230 ft  Mobility Response Tolerated well  Mobility performed by Mobility specialist  $Mobility charge 1 Mobility    Pre-mobility: 71 HR, 146/81 BP, 98% SpO2 Post-mobility: 72 HR, 151/83 BP, 100% SpO2   Pt was sitting on recliner w/ wife present in room upon arrival. Pt agreed to mobility session. Pt independent t/o session. Pt ambulated 230' total in room and hallway. CGA utilized for safety precautions. Pt did not have LOB and did not experience pain in L leg when ambulating. Swelling and redness still present on L leg.  Pt did c/o slight SOB, but states he has h/o SOB and is able to manage it. Overall, pt tolerated session very well. Pt left sitting on recliner w/ wife present in room.    Britney Captain Mobility Specialist  06/28/20, 11:32 AM

## 2020-06-28 NOTE — Progress Notes (Addendum)
Progress Note    Bryan Olson  ZOX:[id number] DOB: April 22, 1961  DOA: 06/24/2020 PCP: Patrice ParadiseMcLaughlin, Miriam K, MD      Brief Narrative:    Medical records reviewed and are as summarized below:  Bryan Olson is a 59 y.o. male       Assessment/Plan:   Principal Problem:   Sepsis (HCC) Active Problems:   Cellulitis   HLD (hyperlipidemia)   HTN (hypertension)   AKI (acute kidney injury) (HCC)   Lactic acidosis   Hypokalemia   Severe sepsis (fever, leukocytosis, tachypnea and tachycardia) due to left lower extremity cellulitis-improving Acute kidney injury (complication of sepsis)-improved Hypertension Hypokalemia Hyperlipidemia Mild thrombocytopenia Morbid obesity  PLAN  Discontinue IV vancomycin Continue IV Rocephin because he still has significant swelling and redness. Analgesics as needed for pain He has been advised to keep legs elevated as much as possible. Encouraged ambulation. Continue antihypertensives Patient has been advised to follow-up with PCP for evaluation for possible obstructive sleep apnea. Plan to discharge home tomorrow.    Peer to peer review was done with Dr. Raenette RoverSetty from insurance company to approve this patient for inpatient stay.   Body mass index is 45.13 kg/m.  (Morbid obesity): This complicates overall care and prognosis  Diet Order            Diet regular Room service appropriate? Yes; Fluid consistency: Thin  Diet effective now                      Medications:   . aspirin EC  81 mg Oral Daily  . atorvastatin  10 mg Oral Daily  . bisoprolol  10 mg Oral Daily  . enoxaparin (LOVENOX) injection  40 mg Subcutaneous Q12H  . losartan  100 mg Oral Daily   Continuous Infusions: . sodium chloride Stopped (06/28/20 0043)  . cefTRIAXone (ROCEPHIN)  IV Stopped (06/27/20 2027)     Anti-infectives (From admission, onward)   Start     Dose/Rate Route Frequency Ordered Stop   06/28/20 1800  vancomycin  (VANCOREADY) IVPB 1250 mg/250 mL  Status:  Discontinued        1,250 mg 166.7 mL/hr over 90 Minutes Intravenous Every 12 hours 06/28/20 0926 06/28/20 1122   06/25/20 2200  vancomycin (VANCOREADY) IVPB 1500 mg/300 mL  Status:  Discontinued        1,500 mg 150 mL/hr over 120 Minutes Intravenous Every 24 hours 06/24/20 2104 06/25/20 0836   06/25/20 2030  cefTRIAXone (ROCEPHIN) 1 g in sodium chloride 0.9 % 100 mL IVPB  Status:  Discontinued        1 g 200 mL/hr over 30 Minutes Intravenous Every 24 hours 06/25/20 0030 06/25/20 0038   06/25/20 1000  vancomycin (VANCOCIN) IVPB 1000 mg/200 mL premix  Status:  Discontinued        1,000 mg 200 mL/hr over 60 Minutes Intravenous Every 12 hours 06/25/20 0836 06/28/20 0926   06/25/20 0045  vancomycin (VANCOCIN) IVPB 1000 mg/200 mL premix  Status:  Discontinued        1,000 mg 200 mL/hr over 60 Minutes Intravenous  Once 06/25/20 0030 06/25/20 0037   06/24/20 2045  vancomycin (VANCOREADY) IVPB 1500 mg/300 mL        1,500 mg 150 mL/hr over 120 Minutes Intravenous  Once 06/24/20 2042 06/24/20 2313   06/24/20 1945  cefTRIAXone (ROCEPHIN) 1 g in sodium chloride 0.9 % 100 mL IVPB        1 g  200 mL/hr over 30 Minutes Intravenous Every 24 hours 06/24/20 1940     06/24/20 1845  vancomycin (VANCOCIN) IVPB 1000 mg/200 mL premix        1,000 mg 200 mL/hr over 60 Minutes Intravenous  Once 06/24/20 1833 06/24/20 2059   06/24/20 1715  cefTRIAXone (ROCEPHIN) injection 1 g        1 g Intramuscular  Once 06/24/20 1712 06/24/20 1730             Family Communication/Anticipated D/C date and plan/Code Status   DVT prophylaxis: enoxaparin (LOVENOX) injection 40 mg Start: 06/25/20 1000     Code Status: Full Code  Family Communication: Plan discussed with his wife at the bedside Disposition Plan:    Status is: Inpatient  Remains inpatient appropriate because:IV treatments appropriate due to intensity of illness or inability to take PO   Dispo: The  patient is from: Home              Anticipated d/c is to: Home              Anticipated d/c date is: > 3 days              Patient currently is not medically stable to d/c.           Subjective:   Pain in the left leg is a little better.  He still has swelling in the left leg.  He said sometimes he has trouble sleeping when he lays down.  No cough, shortness of breath or chest pain.  His wife said that he snores a lot sometimes he stops breathing for a few seconds in his sleep.   Objective:    Vitals:   06/27/20 2019 06/28/20 0509 06/28/20 0908 06/28/20 1150  BP: 135/73 139/76 (!) 144/80 137/82  Pulse: 79 79 78 67  Resp: 20 (!) 22 18 (!) 24  Temp: 99 F (37.2 C) 98.9 F (37.2 C) 98.1 F (36.7 C) 98.6 F (37 C)  TempSrc: Oral Oral Oral Oral  SpO2: 96% 94% 96% 98%  Weight:      Height:       No data found.   Intake/Output Summary (Last 24 hours) at 06/28/2020 1239 Last data filed at 06/28/2020 0908 Gross per 24 hour  Intake 524.01 ml  Output 850 ml  Net -325.99 ml   Filed Weights   06/24/20 0902  Weight: 130.7 kg    Exam:  GEN: NAD SKIN: Warm and dry EYES: Anicteric ENT: No oral lesions CV: RRR PULM: No wheezing or rales. ABD: Soft, nontender CNS: Alert, oriented x3 EXT: Tenderness in the left leg has improved.  There is still swelling and circumferential erythema of the left leg                  Data Reviewed:   I have personally reviewed following labs and imaging studies:  Labs: Labs show the following:   Basic Metabolic Panel: Recent Labs  Lab 06/24/20 0905 06/24/20 0905 06/24/20 1855 06/24/20 1855 06/25/20 0504 06/25/20 0643 06/26/20 0646 06/27/20 0811 06/28/20 0455  NA 139  --  140  --   --  138 140  --   --   K 3.2*   < > 3.6   < >  --  3.0* 3.5  --   --   CL 101  --  100  --   --  105 108  --   --   CO2 25  --  25  --   --  20* 22  --   --   GLUCOSE 152*  --  111*  --   --  110* 119*  --   --   BUN 19  --  24*   --   --  21* 15  --   --   CREATININE 1.52*   < > 1.85*   < > 1.64* 1.33* 1.04 1.05 0.88  CALCIUM 8.7*  --  8.7*  --   --  7.7* 8.1*  --   --   MG  --   --  2.1  --  2.0  --   --   --   --    < > = values in this interval not displayed.   GFR Estimated Creatinine Clearance: 118.9 mL/min (by C-G formula based on SCr of 0.88 mg/dL). Liver Function Tests: Recent Labs  Lab 06/24/20 0905  AST 34  ALT 57*  ALKPHOS 63  BILITOT 2.6*  PROT 7.2  ALBUMIN 3.9   No results for input(s): LIPASE, AMYLASE in the last 168 hours. No results for input(s): AMMONIA in the last 168 hours. Coagulation profile No results for input(s): INR, PROTIME in the last 168 hours.  CBC: Recent Labs  Lab 06/24/20 0905 06/25/20 0643 06/26/20 0646 06/27/20 0811  WBC 17.0* 10.9* 8.5 8.6  NEUTROABS 15.0*  --  6.4 6.3  HGB 15.8 13.9 12.8* 13.3  HCT 45.2 38.9* 37.9* 37.7*  MCV 85.9 85.3 88.1 85.3  PLT 166 126* 131* 148*   Cardiac Enzymes: No results for input(s): CKTOTAL, CKMB, CKMBINDEX, TROPONINI in the last 168 hours. BNP (last 3 results) No results for input(s): PROBNP in the last 8760 hours. CBG: Recent Labs  Lab 06/24/20 1904  GLUCAP 92   D-Dimer: No results for input(s): DDIMER in the last 72 hours. Hgb A1c: No results for input(s): HGBA1C in the last 72 hours. Lipid Profile: No results for input(s): CHOL, HDL, LDLCALC, TRIG, CHOLHDL, LDLDIRECT in the last 72 hours. Thyroid function studies: No results for input(s): TSH, T4TOTAL, T3FREE, THYROIDAB in the last 72 hours.  Invalid input(s): FREET3 Anemia work up: No results for input(s): VITAMINB12, FOLATE, FERRITIN, TIBC, IRON, RETICCTPCT in the last 72 hours. Sepsis Labs: Recent Labs  Lab 06/24/20 0905 06/24/20 1855 06/24/20 2055 06/25/20 0643 06/26/20 0646 06/27/20 0811  WBC 17.0*  --   --  10.9* 8.5 8.6  LATICACIDVEN 1.3 2.0* 1.3  --   --   --     Microbiology Recent Results (from the past 240 hour(s))  SARS Coronavirus 2 by  RT PCR (hospital order, performed in [facility] hospital lab) Nasopharyngeal Nasopharyngeal Swab     Status: None   Collection Time: 06/24/20  7:50 PM   Specimen: Nasopharyngeal Swab  Result Value Ref Range Status   SARS Coronavirus 2 NEGATIVE NEGATIVE Final    Comment: (NOTE) SARS-CoV-2 target nucleic acids are NOT DETECTED.  The SARS-CoV-2 RNA is generally detectable in upper and lower respiratory specimens during the acute phase of infection. The lowest concentration of SARS-CoV-2 viral copies this assay can detect is 250 copies / mL. A negative result does not preclude SARS-CoV-2 infection and should not be used as the sole basis for treatment or other patient management decisions.  A negative result may occur with improper specimen collection / handling, submission of specimen other than nasopharyngeal swab, presence of viral mutation(s) within the areas targeted by this assay, and inadequate number of viral copies (<250  copies / mL). A negative result must be combined with clinical observations, patient history, and epidemiological information.  Fact Sheet for Patients:   BoilerBrush.com.cy  Fact Sheet for Healthcare Providers: https://pope.com/  This test is not yet approved or  cleared by the [place] FDA and has been authorized for detection and/or diagnosis of SARS-CoV-2 by FDA under an Emergency Use Authorization (EUA).  This EUA will remain in effect (meaning this test can be used) for the duration of the COVID-19 declaration under Section 564(b)(1) of the Act, 21 U.S.C. section 360bbb-3(b)(1), unless the authorization is terminated or revoked sooner.  Performed at [facility], [address] [place], [place] [address]   Culture, blood (single)     Status: None (Preliminary result)   Collection Time: 06/24/20  7:50 PM   Specimen: BLOOD  Result Value Ref Range Status   Specimen Description BLOOD LAC   Final   Special Requests BOTTLES DRAWN AEROBIC AND ANAEROBIC BCAV  Final   Culture   Final    NO GROWTH 4 DAYS Performed at [facility], [address] [place], [place] [address]    Report Status PENDING  Incomplete  MRSA PCR Screening     Status: None   Collection Time: 06/27/20  3:28 PM   Specimen: Nasal Mucosa; Nasopharyngeal  Result Value Ref Range Status   MRSA by PCR NEGATIVE NEGATIVE Final    Comment:        The GeneXpert MRSA Assay (FDA approved for NASAL specimens only), is one component of a comprehensive MRSA colonization surveillance program. It is not intended to diagnose MRSA infection nor to guide or monitor treatment for MRSA infections. Performed at [facility], [address] [place], [place] [address]     Procedures and diagnostic studies:  No results found.             LOS: 4 days   Yasheka Fossett  [facility]   Pager on [email or web address]. If 7PM-7AM, please contact night-coverage at [email or web address]     06/28/2020, 12:39 PM

## 2020-06-29 LAB — CULTURE, BLOOD (SINGLE): Culture: NO GROWTH

## 2020-06-29 MED ORDER — CEFADROXIL 500 MG PO CAPS: 500.0000 mg | ORAL_CAPSULE | Freq: Two times a day (BID) | ORAL | 0 refills | Status: AC

## 2020-06-29 NOTE — Progress Notes (Signed)
Larey Seat to be D/C'd Home per MD order.  Discussed prescriptions and follow up appointments with the patient. Prescriptions given to patient, medication list explained in detail. Pt verbalized understanding.  Allergies as of 06/29/2020   No Known Allergies     Medication List    TAKE these medications   ascorbic acid 500 MG tablet Commonly known as: VITAMIN C Take 500 mg by mouth daily.   aspirin EC 81 MG tablet Take by mouth.   bisoprolol-hydrochlorothiazide 10-6.25 MG tablet Commonly known as: ZIAC Take by mouth.   cefadroxil 500 MG capsule Commonly known as: DURICEF Take 1 capsule (500 mg total) by mouth 2 (two) times daily for 5 days.   cholecalciferol 25 MCG (1000 UNIT) tablet Commonly known as: VITAMIN D3 Take 1,000 Units by mouth daily.   losartan 100 MG tablet Commonly known as: COZAAR Take by mouth.       Vitals:   06/28/20 1948 06/29/20 0503  BP: (!) 143/68 (!) 131/59  Pulse: 82 63  Resp: 18 20  Temp: 98.9 F (37.2 C) 98.5 F (36.9 C)  SpO2: 98% 96%    Skin clean, dry and intact without evidence of skin break down, no evidence of skin tears noted. IV catheter discontinued intact. Site without signs and symptoms of complications. Dressing and pressure applied. Pt denies pain at this time. No complaints noted.  An After Visit Summary was printed and given to the patient. Patient escorted via WC, and D/C home via private auto.  Madie Reno, RN

## 2020-06-29 NOTE — Discharge Summary (Addendum)
Physician Discharge Summary  Bryan Olson WUJ:[id number] DOB: 1961/09/22 DOA: 06/24/2020  PCP: Patrice Paradise, MD  Admit date: 06/24/2020 Discharge date: 06/29/2020  Discharge disposition: Home   Recommendations for Outpatient Follow-Up:   Follow-up with PCP 1 week Recommend follow-up with pulmonologist for sleep study   Discharge Diagnosis:   Principal Problem:   Sepsis (HCC) Active Problems:   Cellulitis   HLD (hyperlipidemia)   HTN (hypertension)   AKI (acute kidney injury) (HCC)   Lactic acidosis   Hypokalemia    Discharge Condition: Stable.  Diet recommendation:  Diet Order            Diet - low sodium heart healthy           Diet regular Room service appropriate? Yes; Fluid consistency: Thin  Diet effective now                   Code Status: Full Code     Hospital Course:    [name] is a 59 year old man with medical history significant for hypertension, hyperlipidemia, morbid obesity, with painful swelling and redness of the left leg.  He also had fever, chills and sweating at home 1 day prior to admission.  He said he scratched his left leg on an old crab pot about a week prior to admission.  He also said he got into the swimming pool at [facility] after this injury on his left leg. The pain and redness worsened and extended up into the left inner thigh.  Work-up revealed severe sepsis (lactic acid: 2, fever, leukocytosis, tachycardia and tachypnea) secondary to left lower extremity cellulitis complicated by acute kidney injury and thrombocytopenia.  He was treated with analgesics, IV fluids and empiric IV vancomycin and ceftriaxone.  Erythema had extended up to the left upper thigh.  MRI of the left leg was obtained because of severity of the left lower extremity cellulitis.  Fortunately, there was no evidence of abscess, compartment syndrome, bone involvement or necrotizing fasciitis.  He also had hypokalemia that was treated.   His symptoms slowly improved.  He was able to bear weight on the left lower extremity and ambulate in the hallway.  Left thigh swelling and tenderness have completely resolved.  He still has erythema and mild swelling on the left leg.  His condition has improved to the point where he can be discharged on oral antibiotics.  He is deemed stable for discharge today.  Discharge plan was discussed with the patient and his wife at the bedside.     Discharge Exam:    Vitals:   06/28/20 0908 06/28/20 1150 06/28/20 1948 06/29/20 0503  BP: (!) 144/80 137/82 (!) 143/68 (!) 131/59  Pulse: 78 67 82 63  Resp: 18 (!) 24 18 20   Temp: 98.1 F (36.7 C) 98.6 F (37 C) 98.9 F (37.2 C) 98.5 F (36.9 C)  TempSrc: Oral Oral Oral Oral  SpO2: 96% 98% 98% 96%  Weight:      Height:         GEN: NAD SKIN: No rash EYES: EOMI ENT: MMM CV: RRR PULM: CTA B ABD: soft, obese, NT, +BS CNS: AAO x 3, non focal EXT: Mild left leg swelling.  Erythema on the left leg is still present but has improved   The results of significant diagnostics from this hospitalization (including imaging, microbiology, ancillary and laboratory) are listed below for reference.     Procedures and Diagnostic Studies:   DG Chest 2  View  Result Date: 06/24/2020 CLINICAL DATA:  Fever. EXAM: CHEST - 2 VIEW COMPARISON:  06/16/2011. FINDINGS: Mediastinum and hilar structures normal. Heart size normal. Mild left base infiltrate cannot be excluded. No pleural effusion or pneumothorax. Degenerative change thoracic spine. IMPRESSION: Mild left base infiltrate cannot be excluded. Electronically Signed   By: Maisie Fus  Register   On: 06/24/2020 09:30   DG Tibia/Fibula Left  Result Date: 06/24/2020 CLINICAL DATA:  Pain infection EXAM: LEFT TIBIA AND FIBULA - 2 VIEW COMPARISON:  None. FINDINGS: No fracture or malalignment. No periostitis or bone destruction. Soft tissue edema without emphysema or radiopaque foreign body IMPRESSION: Soft tissue  edema. No acute osseous abnormality. Electronically Signed   By: Jasmine Pang M.D.   On: 06/24/2020 20:14   MR TIBIA FIBULA LEFT W WO CONTRAST  Result Date: 06/25/2020 CLINICAL DATA:  Left lower extremity redness and swelling EXAM: MRI OF LOWER LEFT EXTREMITY WITHOUT AND WITH CONTRAST TECHNIQUE: Multiplanar, multisequence MR imaging of the left tibia and fibula was performed both before and after administration of intravenous contrast. CONTRAST:  55mL GADAVIST GADOBUTROL 1 MMOL/ML IV SOLN COMPARISON:  X-ray 06/24/2020 FINDINGS: Bones/Joint/Cartilage No acute fracture or dislocation. No bone marrow edema. No periostitis. There is a multilobulated cystic structure at the anterior left knee joint line adjacent to the anterior horn of the lateral meniscus measuring 2.2 x 1.8 cm (series 11, image 9) which could reflect a ganglion cyst or parameniscal cyst. Evaluation for internal derangement is limited on large field-of-view sequences. Ligaments Visualized ligaments at the knee and ankle appear grossly intact. Muscles and Tendons Preserved muscle bulk. No intramuscular edema or fluid. No deep fascial fluid between the intramuscular compartments of the left lower leg. Intact tendinous structures. No tenosynovial fluid. Soft tissues Mild-to-moderate circumferential subcutaneous edema of the left lower leg. No organized or rim enhancing fluid collection. No deep soft tissue ulceration. IMPRESSION: 1. Mild-to-moderate circumferential subcutaneous edema of the left lower leg, nonspecific but may reflect cellulitis. No organized or rim enhancing fluid collection. 2. No deep soft tissue or perifascial edema. 3. No evidence of osteomyelitis. 4. Multilobulated cystic structure at the anterior left knee joint line adjacent to the anterior horn of the lateral meniscus measuring 2.2 x 1.8 cm which could reflect a ganglion cyst or parameniscal cyst. Evaluation for internal derangement is limited on large field-of-view sequences.  Electronically Signed   By: Duanne Guess D.O.   On: 06/25/2020 13:22   US Venous Img Lower Unilateral Left  Result Date: 06/24/2020 CLINICAL DATA:  Left leg pain, edema, and erythema for 1 week EXAM: LEFT LOWER EXTREMITY VENOUS DOPPLER ULTRASOUND TECHNIQUE: Gray-scale sonography with compression, as well as color and duplex ultrasound, were performed to evaluate the deep venous system(s) from the level of the common femoral vein through the popliteal and proximal calf veins. COMPARISON:  None. FINDINGS: VENOUS Normal compressibility of the common femoral, superficial femoral, and popliteal veins, as well as the visualized calf veins. Visualized portions of profunda femoral vein and great saphenous vein unremarkable. No filling defects to suggest DVT on grayscale or color Doppler imaging. Doppler waveforms show normal direction of venous flow, normal respiratory plasticity and response to augmentation. Limited views of the contralateral common femoral vein are unremarkable. OTHER Borderline enlarged lymph nodes in the left inguinal region measure up to 16 mm in short axis, likely reactive. Limitations: none IMPRESSION: 1. No evidence of deep venous thrombosis within the left lower extremity. 2. Likely reactive lymph nodes within the left inguinal region.  Electronically Signed   By: Sharlet SalinaMichael  Brown M.D.   On: 06/24/2020 18:29     Labs:   Basic Metabolic Panel: Recent Labs  Lab 06/24/20 0905 06/24/20 0905 06/24/20 1855 06/24/20 1855 06/25/20 0504 06/25/20 0643 06/26/20 0646 06/27/20 0811 06/28/20 0455  NA 139  --  140  --   --  138 140  --   --   K 3.2*   < > 3.6   < >  --  3.0* 3.5  --   --   CL 101  --  100  --   --  105 108  --   --   CO2 25  --  25  --   --  20* 22  --   --   GLUCOSE 152*  --  111*  --   --  110* 119*  --   --   BUN 19  --  24*  --   --  21* 15  --   --   CREATININE 1.52*   < > 1.85*   < > 1.64* 1.33* 1.04 1.05 0.88  CALCIUM 8.7*  --  8.7*  --   --  7.7* 8.1*  --    --   MG  --   --  2.1  --  2.0  --   --   --   --    < > = values in this interval not displayed.   GFR Estimated Creatinine Clearance: 118.9 mL/min (by C-G formula based on SCr of 0.88 mg/dL). Liver Function Tests: Recent Labs  Lab 06/24/20 0905  AST 34  ALT 57*  ALKPHOS 63  BILITOT 2.6*  PROT 7.2  ALBUMIN 3.9   No results for input(s): LIPASE, AMYLASE in the last 168 hours. No results for input(s): AMMONIA in the last 168 hours. Coagulation profile No results for input(s): INR, PROTIME in the last 168 hours.  CBC: Recent Labs  Lab 06/24/20 0905 06/25/20 0643 06/26/20 0646 06/27/20 0811  WBC 17.0* 10.9* 8.5 8.6  NEUTROABS 15.0*  --  6.4 6.3  HGB 15.8 13.9 12.8* 13.3  HCT 45.2 38.9* 37.9* 37.7*  MCV 85.9 85.3 88.1 85.3  PLT 166 126* 131* 148*   Cardiac Enzymes: No results for input(s): CKTOTAL, CKMB, CKMBINDEX, TROPONINI in the last 168 hours. BNP: Invalid input(s): POCBNP CBG: Recent Labs  Lab 06/24/20 1904  GLUCAP 92   D-Dimer No results for input(s): DDIMER in the last 72 hours. Hgb A1c No results for input(s): HGBA1C in the last 72 hours. Lipid Profile No results for input(s): CHOL, HDL, LDLCALC, TRIG, CHOLHDL, LDLDIRECT in the last 72 hours. Thyroid function studies No results for input(s): TSH, T4TOTAL, T3FREE, THYROIDAB in the last 72 hours.  Invalid input(s): FREET3 Anemia work up No results for input(s): VITAMINB12, FOLATE, FERRITIN, TIBC, IRON, RETICCTPCT in the last 72 hours. Microbiology Recent Results (from the past 240 hour(s))  SARS Coronavirus 2 by RT PCR (hospital order, [facility]) Nasopharyngeal Nasopharyngeal Swab     Status: None   Collection Time: 06/24/20  7:50 PM   Specimen: Nasopharyngeal Swab  Result Value Ref Range Status   SARS Coronavirus 2 NEGATIVE NEGATIVE Final    Comment: (NOTE) SARS-CoV-2 target nucleic acids are NOT DETECTED.  The SARS-CoV-2 RNA is generally detectable in upper and  lower respiratory specimens during the acute phase of infection. The lowest concentration of SARS-CoV-2 viral copies this assay can detect is 250 copies / mL. A  negative result does not preclude SARS-CoV-2 infection and should not be used as the sole basis for treatment or other patient management decisions.  A negative result may occur with improper specimen collection / handling, submission of specimen other than nasopharyngeal swab, presence of viral mutation(s) within the areas targeted by this assay, and inadequate number of viral copies (<250 copies / mL). A negative result must be combined with clinical observations, patient history, and epidemiological information.  Fact Sheet for Patients:   BoilerBrush.com.cy  Fact Sheet for Healthcare Providers: https://pope.com/  This test is not yet approved or  cleared by the [place] FDA and has been authorized for detection and/or diagnosis of SARS-CoV-2 by FDA under an Emergency Use Authorization (EUA).  This EUA will remain in effect (meaning this test can be used) for the duration of the COVID-19 declaration under Section 564(b)(1) of the Act, 21 U.S.C. section 360bbb-3(b)(1), unless the authorization is terminated or revoked sooner.  Performed at [facility], [address] [place], [place] [address]   Culture, blood (single)     Status: None   Collection Time: 06/24/20  7:50 PM   Specimen: BLOOD  Result Value Ref Range Status   Specimen Description BLOOD LAC  Final   Special Requests BOTTLES DRAWN AEROBIC AND ANAEROBIC BCAV  Final   Culture   Final    NO GROWTH 5 DAYS Performed at [facility], [address] [place], [place] [address]    Report Status 06/29/2020 FINAL  Final  MRSA PCR Screening     Status: None   Collection Time: 06/27/20  3:28 PM   Specimen: Nasal Mucosa; Nasopharyngeal  Result Value Ref Range Status   MRSA by PCR NEGATIVE  NEGATIVE Final    Comment:        The GeneXpert MRSA Assay (FDA approved for NASAL specimens only), is one component of a comprehensive MRSA colonization surveillance program. It is not intended to diagnose MRSA infection nor to guide or monitor treatment for MRSA infections. Performed at [facility], [address], [place], [place] [address]      Discharge Instructions:   Discharge Instructions    Diet - low sodium heart healthy   Complete by: As directed    Discharge instructions   Complete by: As directed    Make appointment with a pulmonologist as soon as possible for a sleep study to evaluate for obstructive sleep apnea.  Follow-up with PCP in 1 week   Increase activity slowly   Complete by: As directed      Allergies as of 06/29/2020   No Known Allergies     Medication List    TAKE these medications   ascorbic acid 500 MG tablet Commonly known as: VITAMIN C Take 500 mg by mouth daily.   aspirin EC 81 MG tablet Take by mouth.   bisoprolol-hydrochlorothiazide 10-6.25 MG tablet Commonly known as: ZIAC Take by mouth.   cefadroxil 500 MG capsule Commonly known as: DURICEF Take 1 capsule (500 mg total) by mouth 2 (two) times daily for 5 days.   cholecalciferol 25 MCG (1000 UNIT) tablet Commonly known as: VITAMIN D3 Take 1,000 Units by mouth daily.   losartan 100 MG tablet Commonly known as: COZAAR Take by mouth.         Time coordinating discharge: 28 minutes  Signed:  Johann Santone  [facility] 06/29/2020, 8:54 AM   Pager on [email or web address]. If 7PM-7AM, please contact night-coverage at [email or web address]

## 2022-03-23 IMAGING — CR DG CHEST 2V
2 series · 2 of 2 positions shown · non-contrast
Comparison: 06/16/2011.

CLINICAL DATA: Fever.

EXAM:
CHEST - 2 VIEW

[chest pa]
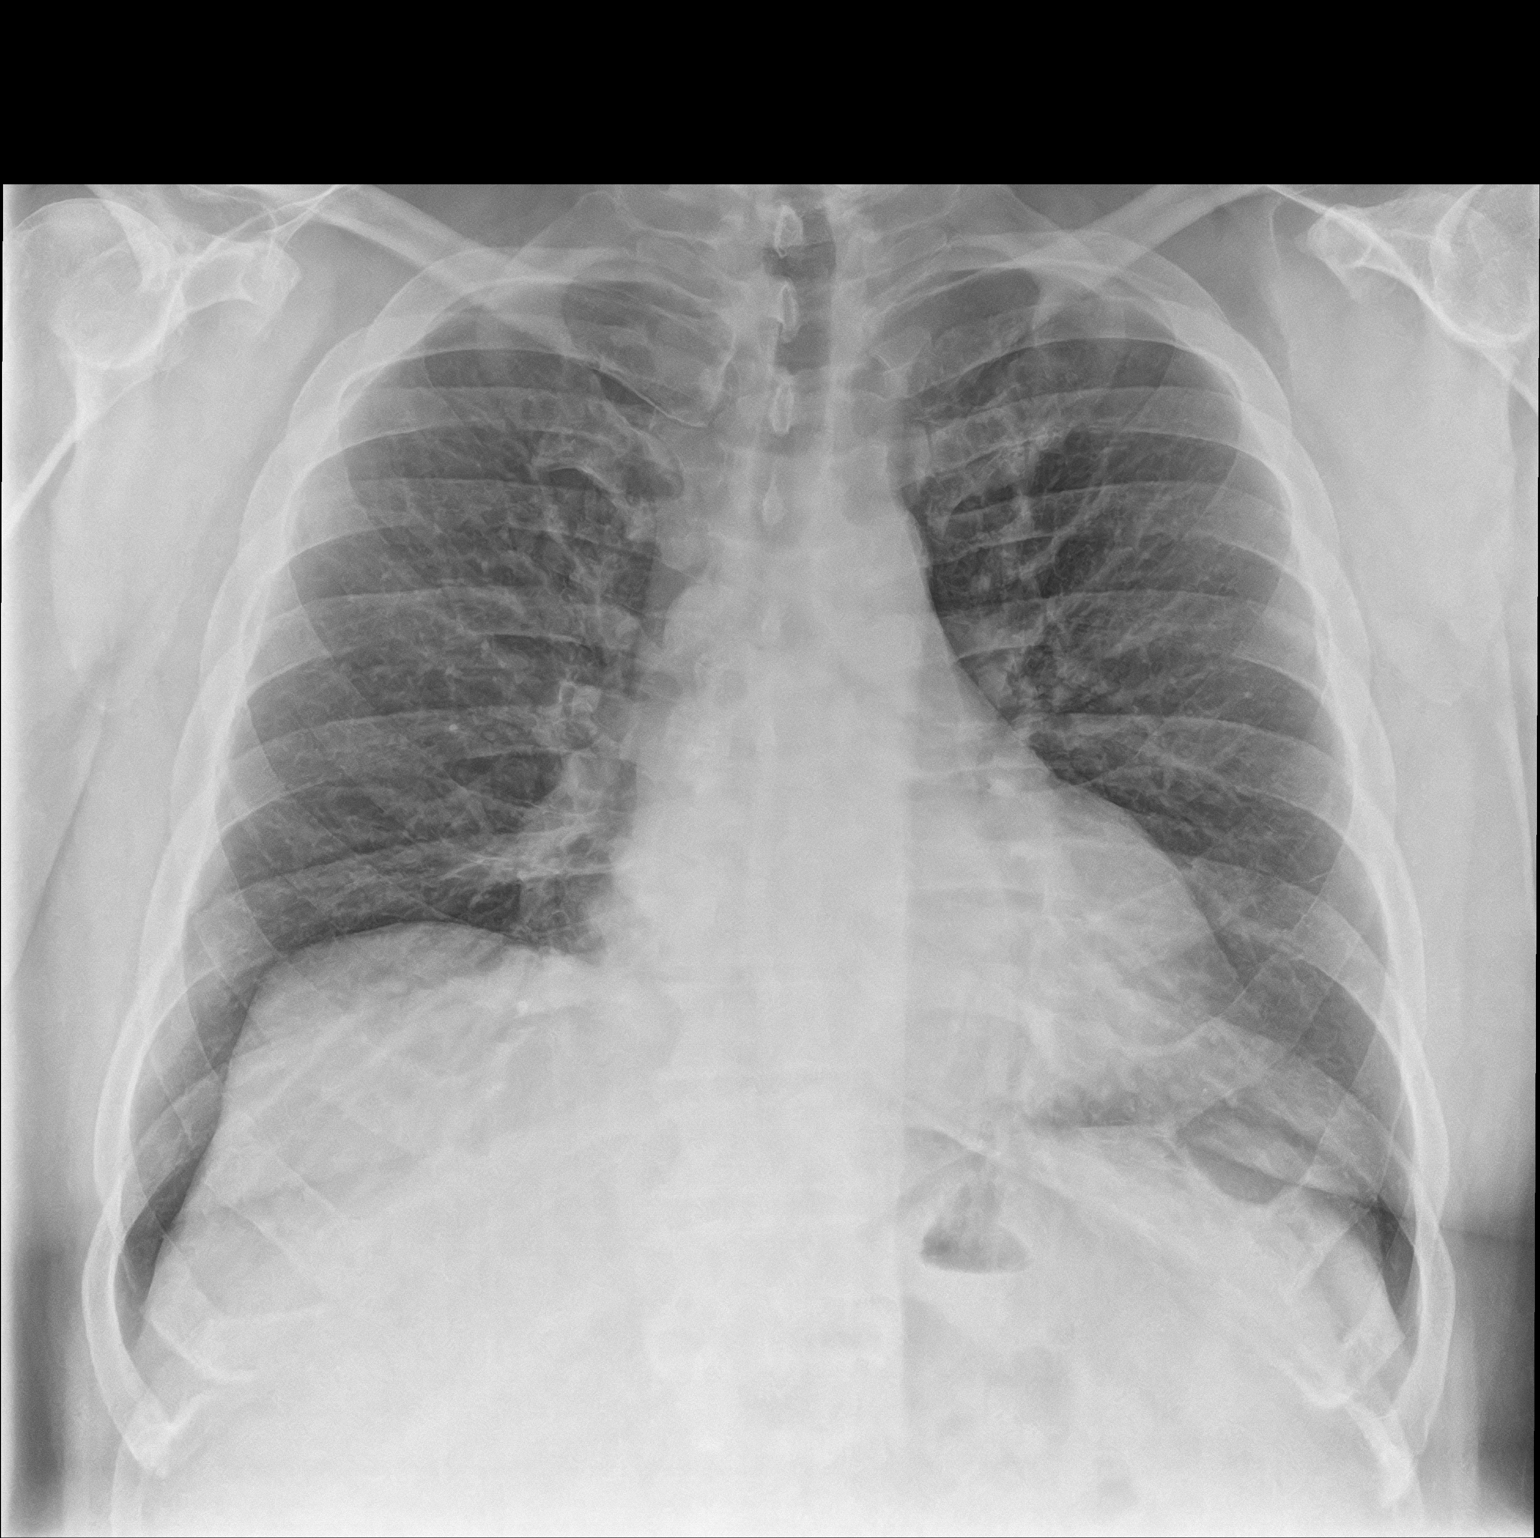

[chest lat]
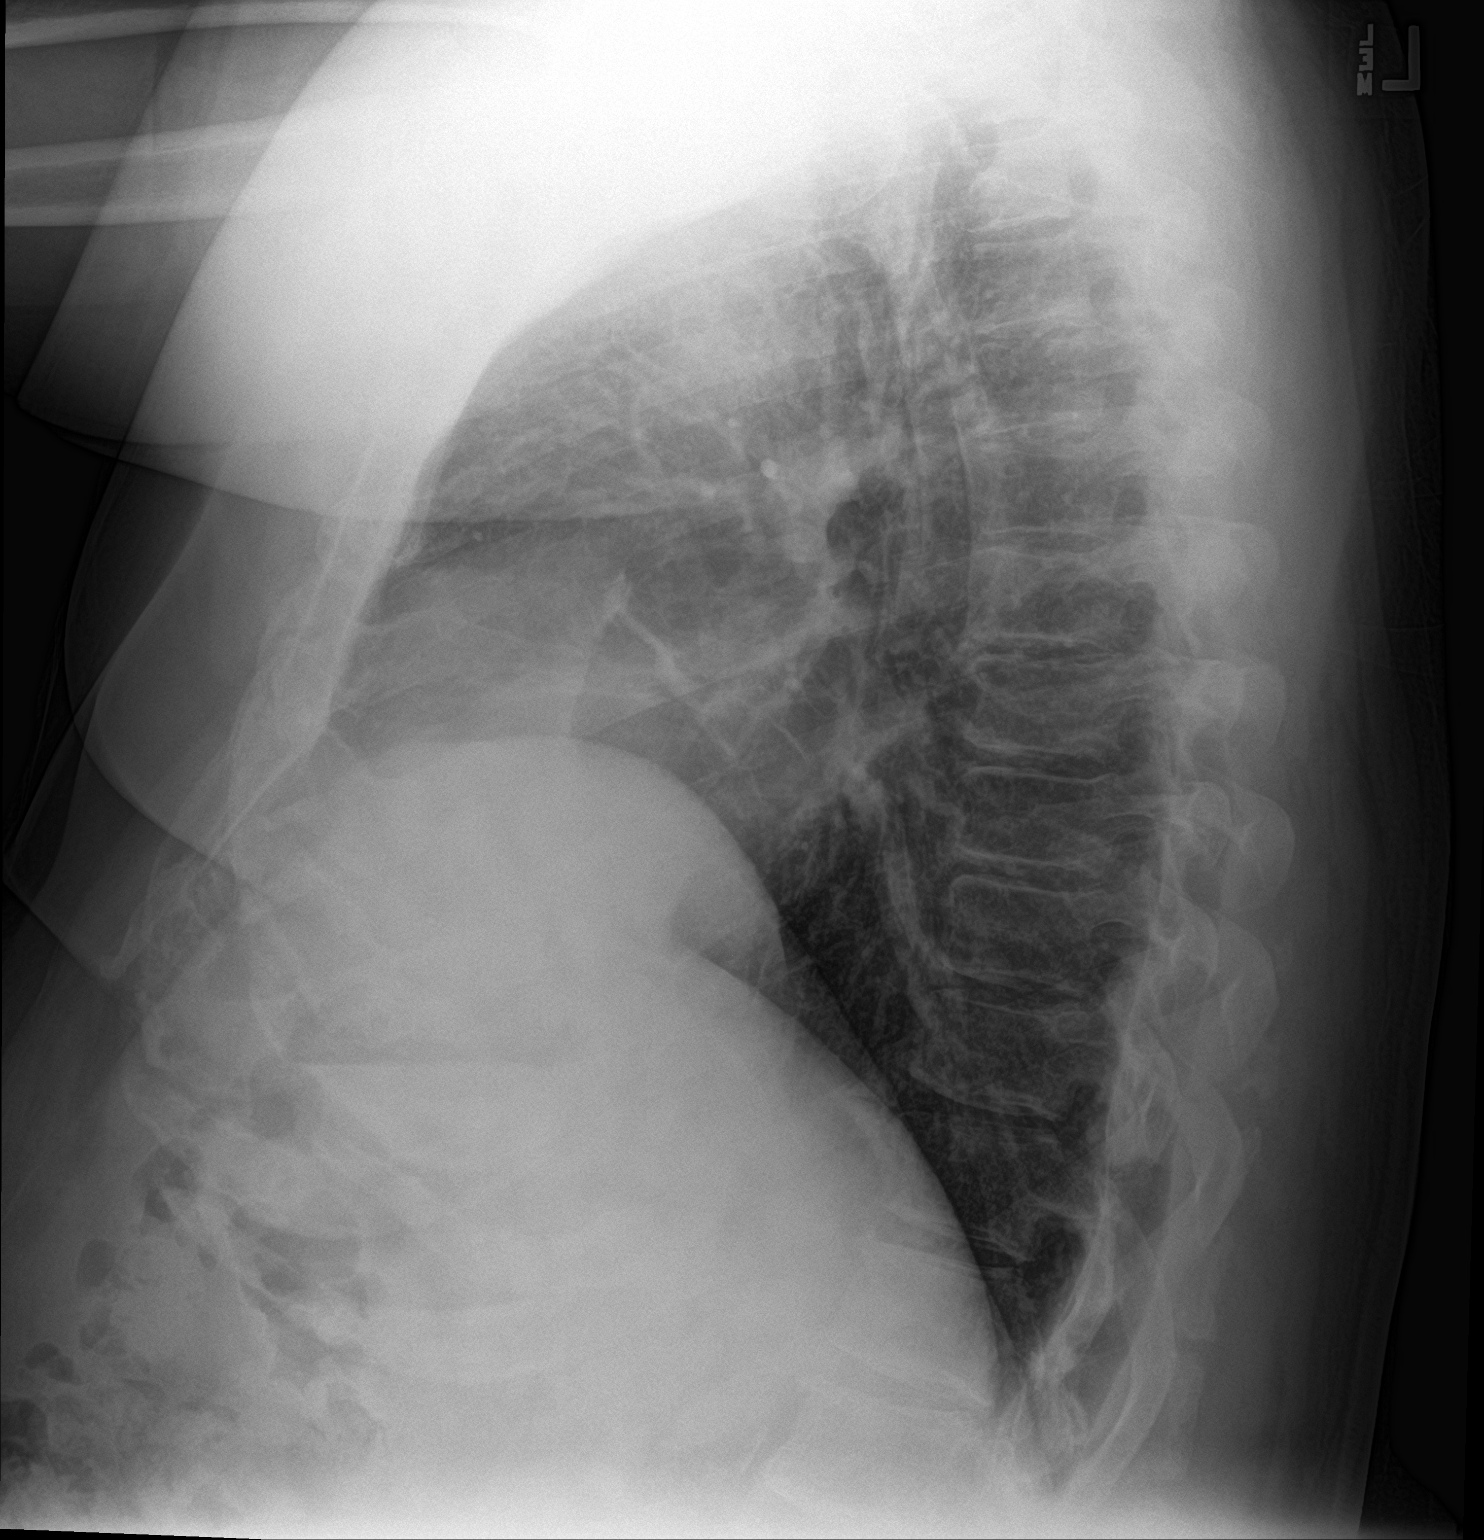

[2 of 2 positions shown; findings below may reference images not displayed]

FINDINGS: Mediastinum and hilar structures normal. Heart size normal. Mild
left base infiltrate cannot be excluded. No pleural effusion or
pneumothorax. Degenerative change thoracic spine.
IMPRESSION: Mild left base infiltrate cannot be excluded.

## 2022-03-24 IMAGING — MR MR [PERSON_NAME] LOW WO/W CM*L*
4 of 9 series · 14 of 40 positions shown · IV contrast (gadavist)
Comparison: X-ray 06/24/2020

CLINICAL DATA: Left lower extremity redness and swelling

EXAM:
MRI OF LOWER LEFT EXTREMITY WITHOUT AND WITH CONTRAST
TECHNIQUE: Multiplanar, multisequence MR imaging of the left tibia and fibula
was performed both before and after administration of intravenous
contrast.
CONTRAST:  10mL GADAVIST GADOBUTROL 1 MMOL/ML IV SOLN

[Series 4: [id number] · coronal · left · 4.0mm · 0.48mm/px · 3 of 34 slices shown]
[im 1/34]
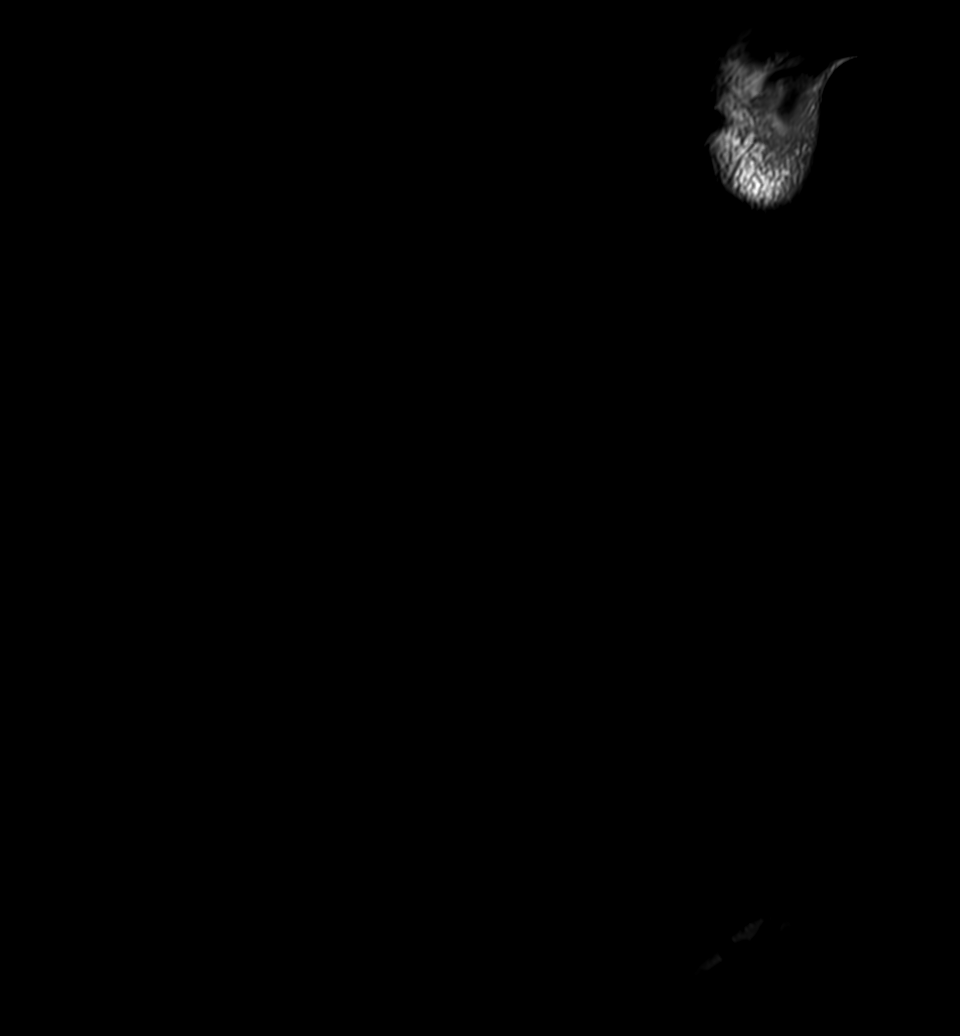
[im 17/34]
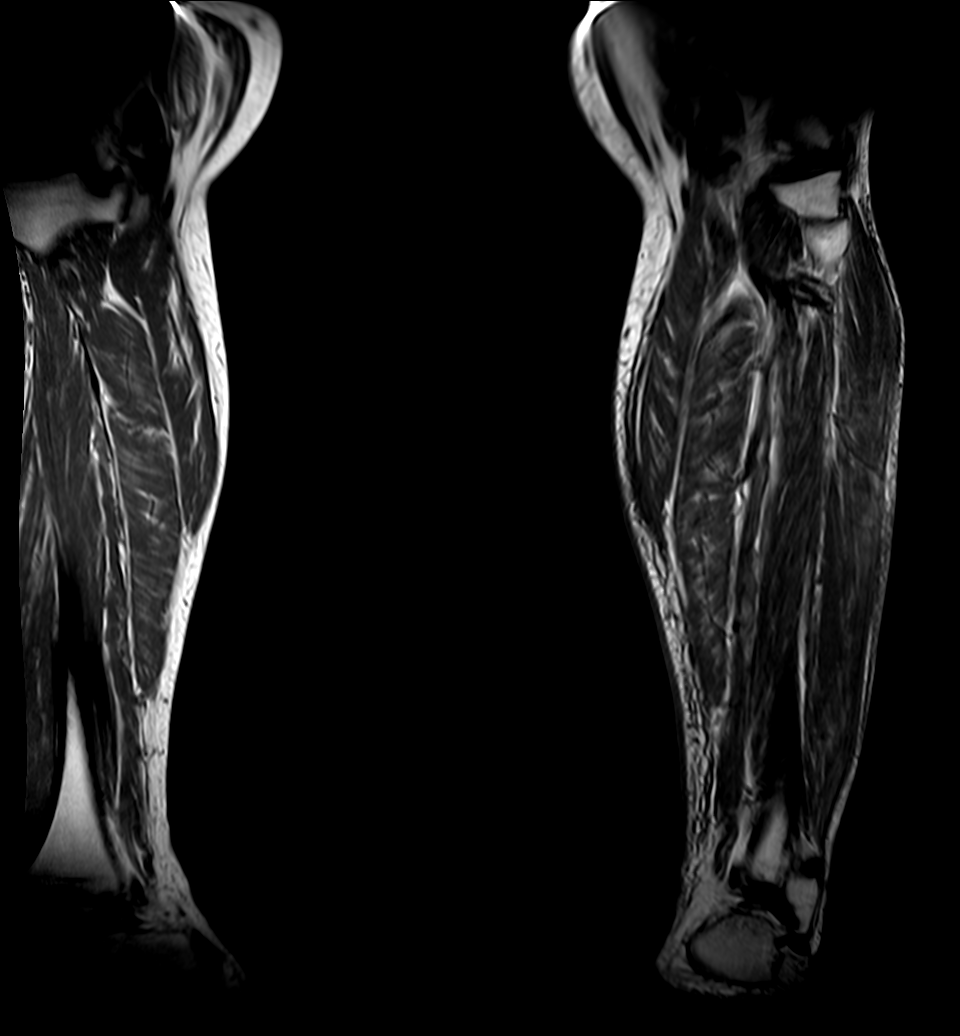
[im 34/34]
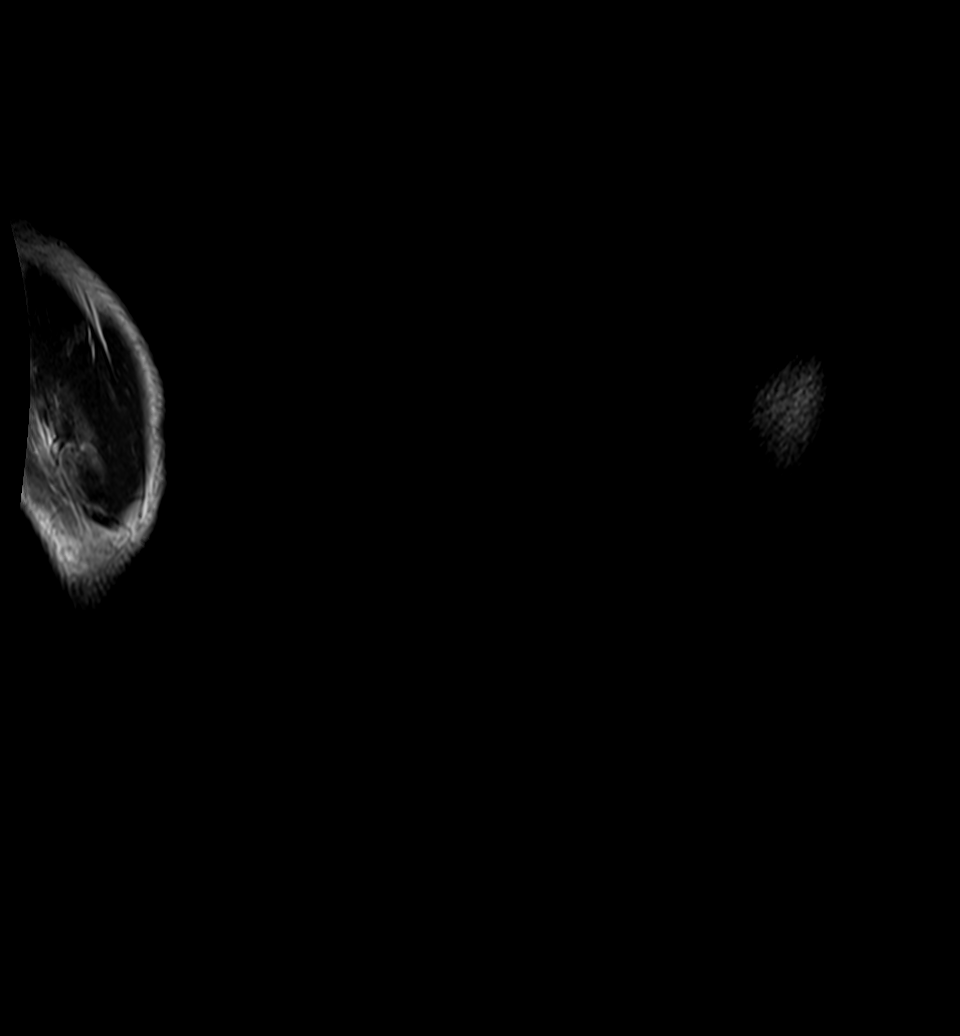

[Series 5: [id number] · coronal · left · 4.0mm · 0.66mm/px · 3 of 34 slices shown]
[im 1/34]
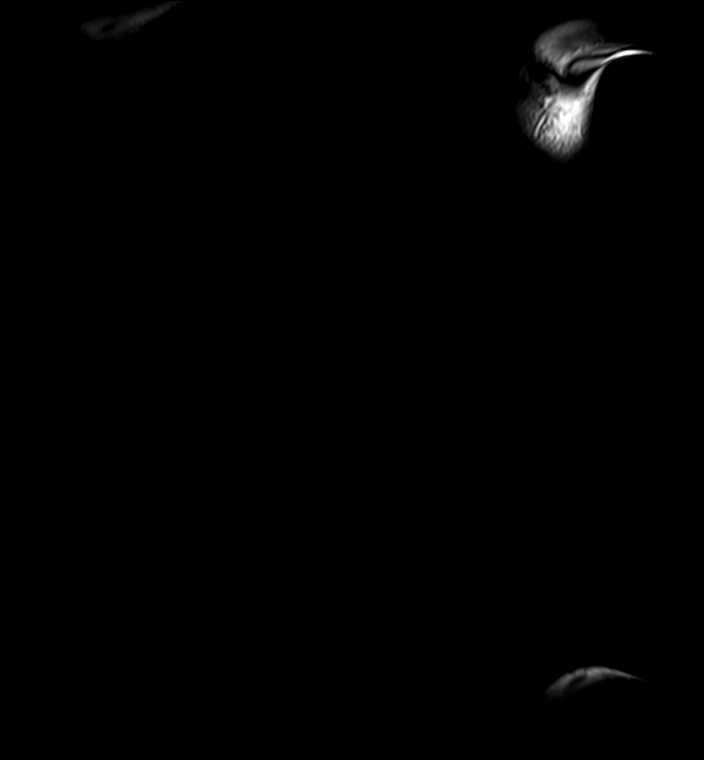
[im 17/34]
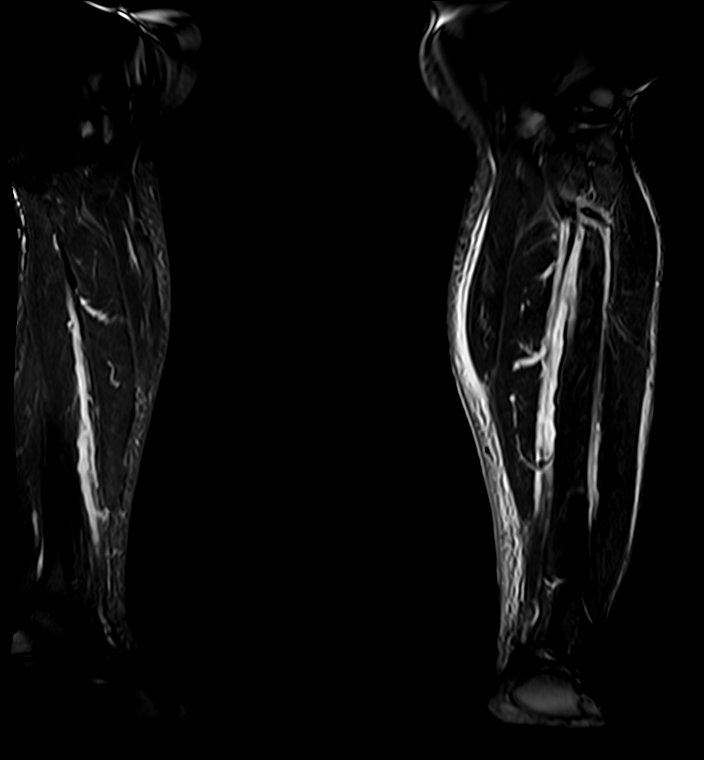
[im 34/34]
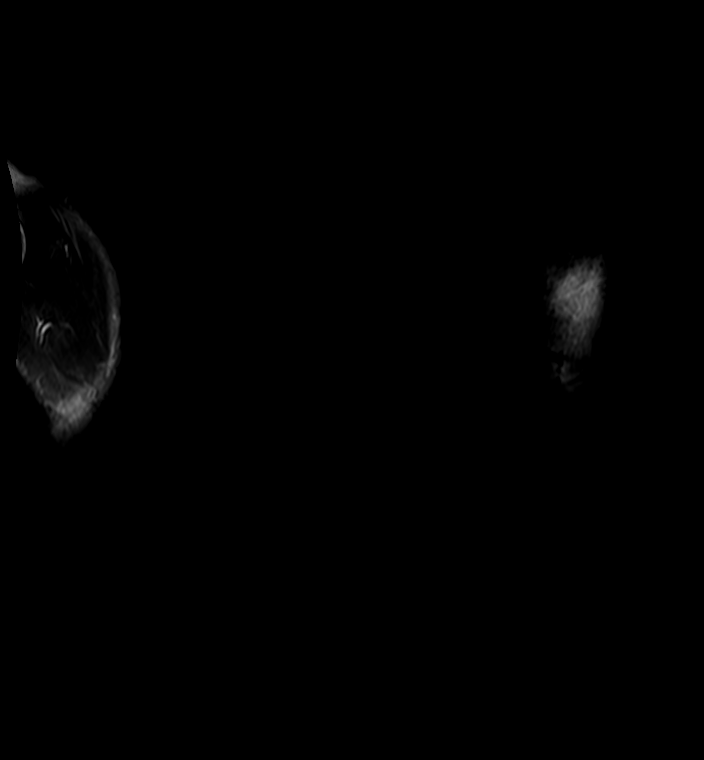

[Series 15: T1 fat-sat · axial · left · 4.0mm · 0.70mm/px · z∈[-255,+99]mm · 5 of 88 slices shown (1 of 2)]
[im 1/88]
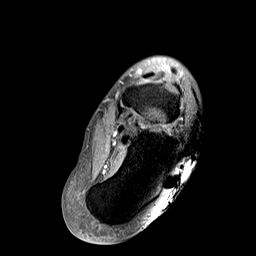
[im 15/88]
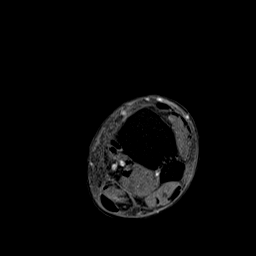
[im 30/88]
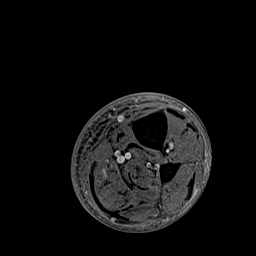
[im 44/88]
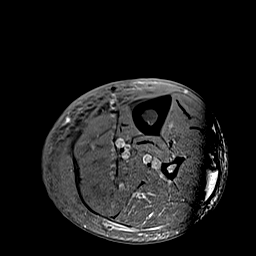
[im 73/88]
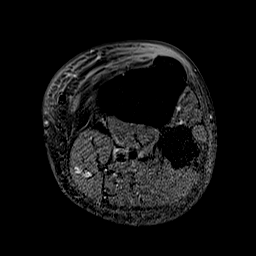

[Series 18: T1 fat-sat · axial · left · 4.0mm · 0.70mm/px · z∈[-186,+99]mm · 3 of 88 slices shown (2 of 2)]
[im 15/88]
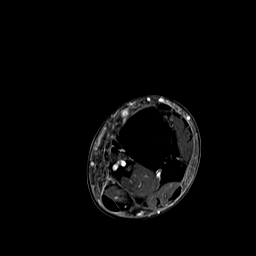
[im 44/88]
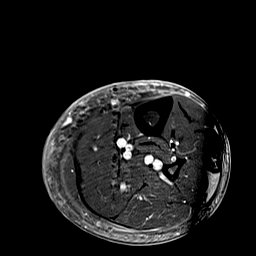
[im 73/88]
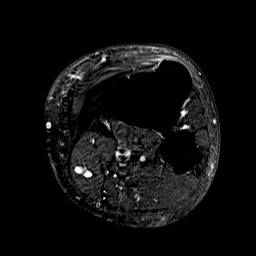

[14 of 40 positions shown; findings below may reference images not displayed]

FINDINGS: Bones/Joint/Cartilage

No acute fracture or dislocation. No bone marrow edema. No
periostitis. There is a multilobulated cystic structure at the
anterior left knee joint line adjacent to the anterior horn of the
lateral meniscus measuring 2.2 x 1.8 cm (series 11, image 9) which
could reflect a ganglion cyst or parameniscal cyst. Evaluation for
internal derangement is limited on large field-of-view sequences.

Ligaments

Visualized ligaments at the knee and ankle appear grossly intact.

Muscles and Tendons

Preserved muscle bulk. No intramuscular edema or fluid. No deep
fascial fluid between the intramuscular compartments of the left
lower leg. Intact tendinous structures. No tenosynovial fluid.

Soft tissues

Mild-to-moderate circumferential subcutaneous edema of the left
lower leg. No organized or rim enhancing fluid collection. No deep
soft tissue ulceration.
IMPRESSION: 1. Mild-to-moderate circumferential subcutaneous edema of the left
lower leg, nonspecific but may reflect cellulitis. No organized or
rim enhancing fluid collection.
2. No deep soft tissue or perifascial edema.
3. No evidence of osteomyelitis.
4. Multilobulated cystic structure at the anterior left knee joint
line adjacent to the anterior horn of the lateral meniscus measuring
2.2 x 1.8 cm which could reflect a ganglion cyst or parameniscal
cyst. Evaluation for internal derangement is limited on large
field-of-view sequences.
